# Patient Record
Sex: Male | Born: 1965 | Race: White | Hispanic: No | Marital: Married | Smoking: Current every day smoker
Health system: Southern US, Community
[De-identification: ages and names within clinical notes are randomized; demographics above are authoritative.]

## PROBLEM LIST (undated history)

## (undated) ENCOUNTER — Emergency Department (HOSPITAL_COMMUNITY): Payer: Medicare Other

## (undated) DIAGNOSIS — I10 Essential (primary) hypertension: Secondary | ICD-10-CM

## (undated) DIAGNOSIS — E7800 Pure hypercholesterolemia, unspecified: Secondary | ICD-10-CM

## (undated) DIAGNOSIS — K259 Gastric ulcer, unspecified as acute or chronic, without hemorrhage or perforation: Secondary | ICD-10-CM

## (undated) DIAGNOSIS — F329 Major depressive disorder, single episode, unspecified: Secondary | ICD-10-CM

## (undated) DIAGNOSIS — F32A Depression, unspecified: Secondary | ICD-10-CM

## (undated) DIAGNOSIS — M549 Dorsalgia, unspecified: Secondary | ICD-10-CM

## (undated) DIAGNOSIS — E119 Type 2 diabetes mellitus without complications: Secondary | ICD-10-CM

## (undated) DIAGNOSIS — N200 Calculus of kidney: Secondary | ICD-10-CM

## (undated) DIAGNOSIS — F988 Other specified behavioral and emotional disorders with onset usually occurring in childhood and adolescence: Secondary | ICD-10-CM

## (undated) DIAGNOSIS — K219 Gastro-esophageal reflux disease without esophagitis: Secondary | ICD-10-CM

## (undated) DIAGNOSIS — R011 Cardiac murmur, unspecified: Secondary | ICD-10-CM

## (undated) DIAGNOSIS — G8929 Other chronic pain: Secondary | ICD-10-CM

## (undated) DIAGNOSIS — M1990 Unspecified osteoarthritis, unspecified site: Secondary | ICD-10-CM

## (undated) DIAGNOSIS — F419 Anxiety disorder, unspecified: Secondary | ICD-10-CM

## (undated) DIAGNOSIS — G4730 Sleep apnea, unspecified: Secondary | ICD-10-CM

## (undated) DIAGNOSIS — Z8701 Personal history of pneumonia (recurrent): Secondary | ICD-10-CM

## (undated) DIAGNOSIS — Z8709 Personal history of other diseases of the respiratory system: Secondary | ICD-10-CM

## (undated) HISTORY — PX: BACK SURGERY: SHX140

## (undated) HISTORY — PX: ESOPHAGOGASTRODUODENOSCOPY: SHX1529

## (undated) HISTORY — PX: HERNIA REPAIR: SHX51

## (undated) HISTORY — PX: CARDIAC CATHETERIZATION: SHX172

## (undated) HISTORY — PX: TONSILLECTOMY: SUR1361

---

## 2001-02-08 ENCOUNTER — Inpatient Hospital Stay (HOSPITAL_COMMUNITY): Admission: EM | Admit: 2001-02-08 | Discharge: 2001-02-10 | Payer: Self-pay | Admitting: *Deleted

## 2001-02-11 ENCOUNTER — Other Ambulatory Visit (HOSPITAL_COMMUNITY): Admission: RE | Admit: 2001-02-11 | Discharge: 2001-02-17 | Payer: Self-pay | Admitting: Psychiatry

## 2001-12-26 ENCOUNTER — Emergency Department (HOSPITAL_COMMUNITY): Admission: EM | Admit: 2001-12-26 | Discharge: 2001-12-26 | Payer: Self-pay | Admitting: Emergency Medicine

## 2002-01-20 ENCOUNTER — Ambulatory Visit (HOSPITAL_COMMUNITY): Admission: RE | Admit: 2002-01-20 | Discharge: 2002-01-21 | Payer: Self-pay | Admitting: Specialist

## 2002-01-20 ENCOUNTER — Encounter: Payer: Self-pay | Admitting: Specialist

## 2002-06-10 ENCOUNTER — Observation Stay (HOSPITAL_COMMUNITY): Admission: RE | Admit: 2002-06-10 | Discharge: 2002-06-11 | Payer: Self-pay | Admitting: Specialist

## 2002-11-08 ENCOUNTER — Emergency Department (HOSPITAL_COMMUNITY): Admission: EM | Admit: 2002-11-08 | Discharge: 2002-11-08 | Payer: Self-pay | Admitting: Emergency Medicine

## 2002-11-08 ENCOUNTER — Encounter: Payer: Self-pay | Admitting: *Deleted

## 2004-03-15 ENCOUNTER — Encounter: Admission: RE | Admit: 2004-03-15 | Discharge: 2004-03-15 | Payer: Self-pay | Admitting: Specialist

## 2005-02-13 ENCOUNTER — Observation Stay (HOSPITAL_COMMUNITY): Admission: RE | Admit: 2005-02-13 | Discharge: 2005-02-14 | Payer: Self-pay | Admitting: Specialist

## 2011-07-31 ENCOUNTER — Encounter: Payer: Self-pay | Admitting: *Deleted

## 2011-07-31 ENCOUNTER — Other Ambulatory Visit: Payer: Self-pay

## 2011-07-31 ENCOUNTER — Observation Stay (HOSPITAL_COMMUNITY)
Admission: EM | Admit: 2011-07-31 | Discharge: 2011-08-01 | Disposition: A | Discharge status: Home / Self Care | Payer: Medicare (Managed Care) | Attending: Internal Medicine | Admitting: Internal Medicine

## 2011-07-31 ENCOUNTER — Emergency Department (HOSPITAL_COMMUNITY): Payer: Medicare (Managed Care)

## 2011-07-31 DIAGNOSIS — I10 Essential (primary) hypertension: Secondary | ICD-10-CM | POA: Insufficient documentation

## 2011-07-31 DIAGNOSIS — G479 Sleep disorder, unspecified: Secondary | ICD-10-CM | POA: Insufficient documentation

## 2011-07-31 DIAGNOSIS — R0789 Other chest pain: Principal | ICD-10-CM | POA: Insufficient documentation

## 2011-07-31 DIAGNOSIS — F411 Generalized anxiety disorder: Secondary | ICD-10-CM | POA: Insufficient documentation

## 2011-07-31 DIAGNOSIS — E7800 Pure hypercholesterolemia, unspecified: Secondary | ICD-10-CM | POA: Diagnosis present

## 2011-07-31 HISTORY — DX: Pure hypercholesterolemia, unspecified: E78.00

## 2011-07-31 HISTORY — DX: Essential (primary) hypertension: I10

## 2011-07-31 LAB — DIFFERENTIAL
Basophils Relative: 0 % (ref 0–1)
Eosinophils Absolute: 0.3 10*3/uL (ref 0.0–0.7)
Eosinophils Relative: 3 % (ref 0–5)
Lymphocytes Relative: 33 % (ref 12–46)
Lymphs Abs: 3.6 10*3/uL (ref 0.7–4.0)
Monocytes Absolute: 0.9 10*3/uL (ref 0.1–1.0)
Monocytes Relative: 8 % (ref 3–12)
Neutro Abs: 6.3 10*3/uL (ref 1.7–7.7)
Neutrophils Relative %: 57 % (ref 43–77)

## 2011-07-31 LAB — COMPREHENSIVE METABOLIC PANEL
ALT: 24 U/L (ref 0–53)
Albumin: 4.1 g/dL (ref 3.5–5.2)
CO2: 27 mEq/L (ref 19–32)
Calcium: 9.6 mg/dL (ref 8.4–10.5)
GFR calc Af Amer: >90 mL/min (ref >90)
GFR calc non Af Amer: 89 mL/min — ABNORMAL LOW (ref >90)
Potassium: 3.5 mEq/L (ref 3.5–5.1)
Total Bilirubin: 0.3 mg/dL (ref 0.3–1.2)

## 2011-07-31 LAB — CBC
MCH: 28.5 pg (ref 26.0–34.0)
MCHC: 33.7 g/dL (ref 30.0–36.0)

## 2011-07-31 LAB — URINALYSIS, ROUTINE W REFLEX MICROSCOPIC
Hgb urine dipstick: NEGATIVE
Ketones, ur: NEGATIVE mg/dL
Leukocytes, UA: NEGATIVE
Nitrite: NEGATIVE
Protein, ur: NEGATIVE mg/dL
Urobilinogen, UA: 0.2 mg/dL (ref 0.0–1.0)

## 2011-07-31 LAB — POCT I-STAT TROPONIN I: Troponin i, poc: 0 ng/mL (ref 0.00–0.08)

## 2011-07-31 MED ORDER — SODIUM CHLORIDE 0.9 % IV SOLN
INTRAVENOUS | Status: DC
  Administered 2011-07-31: via INTRAVENOUS

## 2011-07-31 NOTE — ED Notes (Signed)
C/o chest pain off and on x several months; reports pain is midchest, nonradiating, and feels like a tightness and pressure; reports that this pain has been coming and going x 2-3 days; states today pain onset approx 10 am and has not eased; states pain is worse with movement and deep breaths; denies dizziness; denies SOB; denies n/v; skin warm and dry.

## 2011-07-31 NOTE — ED Provider Notes (Signed)
History    Scribed for Benny Lennert, MD, the patient was seen in room APA06/APA06. This chart was scribed by Katha Cabal.   CSN: [id number] Arrival date & time: 07/31/2011  7:23 PM   First MD Initiated Contact with Patient 07/31/11 2054      Chief Complaint  Patient presents with  . Chest Pain    x 2 days    (Consider location/radiation/quality/duration/timing/severity/associated sxs/prior treatment) HPI Joseph Shepard is a 45 y.o. male who presents to the Emergency Department complaining of gradual worsening of intermittent sharp tightness chest pains since 10 AM with associated SOB x2 days.  Reports chest pain for past several months.  Denies vomiting.  Patient has baseline diaphoresis and back pain. Patient had cardiac cath 2 years ago at [facility] that was normal.   Pain worsens with exertion. Resting typically helps but not tonight.  Patient had stress test at cardiologist in [place]. Patient with history of HTN and hypercholesterolemia.     PCP Dr. Laurence Compton in [place], [place].      Past Medical History  Diagnosis Date  . Hypertension   . High cholesterol     Past Surgical History  Procedure Date  . Back surgery   . Tonsillectomy   . Hernia repair     No family history on file.  History  Substance Use Topics  . Smoking status: Not on file  . Smokeless tobacco: Not on file  . Alcohol Use: No      Review of Systems  Constitutional: Negative for fatigue. Diaphoresis: baseline.  HENT: Negative for congestion, sinus pressure and ear discharge.   Eyes: Negative for discharge.  Respiratory: Positive for shortness of breath. Negative for cough.   Cardiovascular: Positive for chest pain.  Gastrointestinal: Negative for vomiting, abdominal pain and diarrhea.  Genitourinary: Negative for frequency and hematuria.  Musculoskeletal: Back pain: baseline.  Skin: Negative for rash.  Neurological: Negative for seizures and headaches.  Hematological:  Negative.   Psychiatric/Behavioral: Negative for hallucinations.    Allergies  Review of patient's allergies indicates no known allergies.  Home Medications   Current Outpatient Rx  Name Route Sig Dispense Refill  . ASPIRIN 81 MG PO TBEC Oral Take 81 mg by mouth daily.      Marland Kitchen HYDROCHLOROTHIAZIDE 25 MG PO TABS Oral Take 25 mg by mouth daily.      Marland Kitchen LISINOPRIL 40 MG PO TABS Oral Take 40 mg by mouth daily.      Marland Kitchen PRAVASTATIN SODIUM 20 MG PO TABS Oral Take 20 mg by mouth daily.        BP 129/66  Pulse 63  Temp(Src) 98.1 F (36.7 C) (Oral)  Resp 20  Ht 5\' 9"  (1.753 m)  Wt 275 lb (124.739 kg)  BMI 40.61 kg/m2  SpO2 97%  Physical Exam  Constitutional: He is oriented to person, place, and time. He appears well-developed.  HENT:  Head: Normocephalic and atraumatic.  Eyes: Conjunctivae and EOM are normal. No scleral icterus.  Neck: Neck supple. No thyromegaly present.  Cardiovascular: Normal rate and regular rhythm.  Exam reveals no gallop and no friction rub.   No murmur heard. Pulmonary/Chest: Effort normal and breath sounds normal. No stridor. No respiratory distress. He has no wheezes. He has no rales. He exhibits no tenderness.  Abdominal: He exhibits no distension. There is no tenderness. There is no rebound.  Musculoskeletal: Normal range of motion. He exhibits no edema.  Lymphadenopathy:    He has no cervical adenopathy.  Neurological: He is alert and oriented to person, place, and time. Coordination normal.  Skin: No rash noted. He is not diaphoretic. No erythema.  Psychiatric: He has a normal mood and affect. His behavior is normal.    ED Course  Procedures (including critical care time)   DIAGNOSTIC STUDIES: Oxygen Saturation is 96% on room air, normal by my interpretation.    COORDINATION OF CARE:  9:09 PM  Physical exam complete.  Discussed possibility of admission for stress test.  Will review labs.    11:01 PM  Reviewed labs and CXR.  Discuss need for  admission with patient.   11:21 PM  Plan to admit patient.  Patient agrees with plan.     LABS / RADIOLOGY:   Labs Reviewed  COMPREHENSIVE METABOLIC PANEL - Abnormal; Notable for the following:    GFR calc non Af Amer 89 (*)    All other components within normal limits  CBC - Abnormal; Notable for the following:    WBC 11.1 (*)    RBC 4.21 (*)    Hemoglobin 12.0 (*)    HCT 35.6 (*)    All other components within normal limits  URINALYSIS, ROUTINE W REFLEX MICROSCOPIC - Abnormal; Notable for the following:    Specific Gravity, Urine >1.030 (*)    All other components within normal limits  DIFFERENTIAL  POCT I-STAT TROPONIN I  I-STAT TROPONIN I   Results for orders placed during the hospital encounter of 07/31/11  COMPREHENSIVE METABOLIC PANEL      Component Value Range   Sodium 141  135 - 145 (mEq/L)   Potassium 3.5  3.5 - 5.1 (mEq/L)   Chloride 104  96 - 112 (mEq/L)   CO2 27  19 - 32 (mEq/L)   Glucose, Bld 97  70 - 99 (mg/dL)   BUN 16  6 - 23 (mg/dL)   Creatinine, Ser 6.21  0.50 - 1.35 (mg/dL)   Calcium 9.6  8.4 - 30.8 (mg/dL)   Total Protein 7.1  6.0 - 8.3 (g/dL)   Albumin 4.1  3.5 - 5.2 (g/dL)   AST 20  0 - 37 (U/L)   ALT 24  0 - 53 (U/L)   Alkaline Phosphatase 49  39 - 117 (U/L)   Total Bilirubin 0.3  0.3 - 1.2 (mg/dL)   GFR calc non Af Amer 89 (*) >90 (mL/min)   GFR calc Af Amer >90  >90 (mL/min)  CBC      Component Value Range   WBC 11.1 (*) 4.0 - 10.5 (K/uL)   RBC 4.21 (*) 4.22 - 5.81 (MIL/uL)   Hemoglobin 12.0 (*) 13.0 - 17.0 (g/dL)   HCT 65.7 (*) 84.6 - 52.0 (%)   MCV 84.6  78.0 - 100.0 (fL)   MCH 28.5  26.0 - 34.0 (pg)   MCHC 33.7  30.0 - 36.0 (g/dL)   RDW 96.2  95.2 - 84.1 (%)   Platelets 226  150 - 400 (K/uL)  DIFFERENTIAL      Component Value Range   Neutrophils Relative 57  43 - 77 (%)   Neutro Abs 6.3  1.7 - 7.7 (K/uL)   Lymphocytes Relative 33  12 - 46 (%)   Lymphs Abs 3.6  0.7 - 4.0 (K/uL)   Monocytes Relative 8  3 - 12 (%)   Monocytes  Absolute 0.9  0.1 - 1.0 (K/uL)   Eosinophils Relative 3  0 - 5 (%)   Eosinophils Absolute 0.3  0.0 - 0.7 (K/uL)  Basophils Relative 0  0 - 1 (%)   Basophils Absolute 0.0  0.0 - 0.1 (K/uL)  URINALYSIS, ROUTINE W REFLEX MICROSCOPIC      Component Value Range   Color, Urine YELLOW  YELLOW    Appearance CLEAR  CLEAR    Specific Gravity, Urine >1.030 (*) 1.005 - 1.030    pH 6.0  5.0 - 8.0    Glucose, UA NEGATIVE  NEGATIVE (mg/dL)   Hgb urine dipstick NEGATIVE  NEGATIVE    Bilirubin Urine NEGATIVE  NEGATIVE    Ketones, ur NEGATIVE  NEGATIVE (mg/dL)   Protein, ur NEGATIVE  NEGATIVE (mg/dL)   Urobilinogen, UA 0.2  0.0 - 1.0 (mg/dL)   Nitrite NEGATIVE  NEGATIVE    Leukocytes, UA NEGATIVE  NEGATIVE   POCT I-STAT TROPONIN I      Component Value Range   Troponin i, poc 0.00  0.00 - 0.08 (ng/mL)   Comment 3              Dg Chest Portable 1 View  07/31/2011  *RADIOLOGY REPORT*  Clinical Data: Chest pain.  PORTABLE CHEST - 1 VIEW  Comparison: None.  Findings: Heart and mediastinal contours are within normal limits. No focal opacities or effusions.  No acute bony abnormality.  IMPRESSION: No active cardiopulmonary disease.  Original Report Authenticated By: Cyndie Chime, M.D.        MDM      MEDICATIONS GIVEN IN THE E.D. Scheduled Meds:    . sodium chloride   Intravenous STAT   Continuous Infusions:     Orders Placed This Encounter  Procedures  . DG Chest Portable 1 View  . Comprehensive metabolic panel  . CBC  . Differential  . Urinalysis with microscopic  . Diet regular  . Page Admitting Doctor upon patients arrival to unit/floor  . Vital signs  . POCT i-Stat troponin I  . ED EKG  . Place in observation (from OR/ED)      IMPRESSION: 1. Chest pain     Date: 07/31/2011  Rate:66  Rhythm: normal sinus rhythm  QRS Axis: normal    Intervals: normal  ST/T Wave abnormalities: Lvh  Conduction Disutrbances:none     Narrative Interpretation:   Old EKG Reviewed:  none available     The chart was scribed for me under my direct supervision.  I personally performed the history, physical, and medical decision making and all procedures in the evaluation of this patient.Benny Lennert, MD 07/31/11 [phone]

## 2011-07-31 NOTE — ED Notes (Signed)
Also reports had cardiac cath done in July 2010 and was told "everything was fine"; states he did not go back for his f/u appt after cath.

## 2011-07-31 NOTE — ED Notes (Signed)
Pt states CP in center of chest non radiating; describes as pressure and rates as 6. Pt had a cardiac cath July 2010 states WNL however no follow up with MD afterward.  Pt does follow with primary MD.

## 2011-07-31 NOTE — H&P (Signed)
PCP:   No primary provider on file.   Chief Complaint:  Substernal chest pain.  HPI: Joseph Shepard is an 45 y.o. male with history of hypertension, hypercholesterolemia, and anxiety, presents to [facility] emergency room emergency room with a complaint of substernal chest pain. He stated he has took several chest pain and pressure lasting approximately 2 hours today. He denied any nausea vomiting but had a little bit of diaphoresis. He denied shortness of breath. He denies having exertional chest pain as he has not been very active since he hurt his back. Of importance is that he had a cardiac catheterization approximately 2 years ago, and he was told that it was normal.  He has been very compliant with his medications including pravastatin and lisinopril HCTZ. Normally he does not taking aspirin, but he did take an aspirin today. In the emergency room he is pain-free. He did admit to be anxious in general.  His wife also states that he snores loudly when he sleeps, and that he has periods of apnea. He never had a polysomnogram.  Hospitalist was asked to admit him for rule out.  Past Medical History  Diagnosis Date  . Hypertension   . High cholesterol     Past Surgical History  Procedure Date  . Back surgery   . Tonsillectomy   . Hernia repair     Medications:  HOME MEDS: Prior to Admission medications   Medication Sig Start Date End Date Taking? Authorizing Provider  aspirin 81 MG EC tablet Take 81 mg by mouth daily.     Yes Historical Provider, MD  hydrochlorothiazide (HYDRODIURIL) 25 MG tablet Take 25 mg by mouth daily.     Yes Historical Provider, MD  lisinopril (PRINIVIL,ZESTRIL) 40 MG tablet Take 40 mg by mouth daily.     Yes Historical Provider, MD  pravastatin (PRAVACHOL) 20 MG tablet Take 20 mg by mouth daily.     Yes Historical Provider, MD    PRIOR TO [facility] MEDS Medications Prior to Admission  Medication Dose Route Frequency Provider Last Rate Last Dose  . 0.9 %   sodium chloride infusion   Intravenous STAT Benny Lennert, MD       No current outpatient prescriptions on file as of 07/31/2011.    Allergies:  No Known Allergies  Social History:   does not have a smoking history on file. He does not have any smokeless tobacco history on file. He reports that he uses illicit drugs (Marijuana). He reports that he does not drink alcohol.  Family History: He has  No premature CAD in his immediate family.  Rewiew of Systems:  The patient denies anorexia, fever, weight loss,, vision loss, decreased hearing, hoarseness,  syncope, dyspnea on exertion, peripheral edema, balance deficits, hemoptysis, abdominal pain, melena, hematochezia, severe indigestion/heartburn, hematuria, incontinence, genital sores, muscle weakness, suspicious skin lesions, transient blindness, difficulty walking, depression, unusual weight change, abnormal bleeding, enlarged lymph nodes, angioedema, and breast masses.   Physical Exam: Filed Vitals:   07/31/11 1913 07/31/11 2027 07/31/11 2126 07/31/11 2231  BP: 135/75 121/69 116/68 129/66  Pulse: 67 68 68 63  Temp: 98.1 F (36.7 C)     TempSrc: Oral     Resp: 20 18 20 20   Height: 5\' 9"  (1.753 m)     Weight: 124.739 kg (275 lb)     SpO2: 98% 96% 96% 97%   Blood pressure 129/66, pulse 63, temperature 98.1 F (36.7 C), temperature source Oral, resp. rate 20, height  5\' 9"  (1.753 m), weight 124.739 kg (275 lb), SpO2 97.00%.  [id number] not yet examined person lying in the stretcher in no acute distress; cooperative with exam PSYCH: He is alert and oriented x4; does not appear anxious does not appear depressed; affect is normal HEENT: Mucous membranes pink and anicteric; PERRLA; EOM intact; no cervical lymphadenopathy nor thyromegaly or carotid bruit; no JVD; Breasts:: Not examined CHEST WALL: No tenderness CHEST: Normal respiration, clear to auscultation bilaterally HEART: Regular rate and rhythm; no murmurs rubs or  gallops BACK: No kyphosis or scoliosis; no CVA tenderness ABDOMEN: Obese, soft non-tender; no masses, no organomegaly, normal abdominal bowel sounds; no pannus; no intertriginous candida. Rectal Exam: Not done EXTREMITIES: No bone or joint deformity; age-appropriate arthropathy of the hands and knees; no edema; no ulcerations. Genitalia: not examined PULSES: 2+ and symmetric SKIN: Normal hydration no rash or ulceration CNS: Cranial nerves 2-12 grossly intact no focal neurologic deficit   Labs & Imaging Results for orders placed during the hospital encounter of 07/31/11 (from the past 48 hour(s))  POCT I-STAT TROPONIN I     Status: Normal   Collection Time   07/31/11  7:38 PM      Component Value Range Comment   Troponin i, poc 0.00  0.00 - 0.08 (ng/mL)    Comment 3            COMPREHENSIVE METABOLIC PANEL     Status: Abnormal   Collection Time   07/31/11  7:54 PM      Component Value Range Comment   Sodium 141  135 - 145 (mEq/L)    Potassium 3.5  3.5 - 5.1 (mEq/L)    Chloride 104  96 - 112 (mEq/L)    CO2 27  19 - 32 (mEq/L)    Glucose, Bld 97  70 - 99 (mg/dL)    BUN 16  6 - 23 (mg/dL)    Creatinine, Ser 9.81  0.50 - 1.35 (mg/dL)    Calcium 9.6  8.4 - 10.5 (mg/dL)    Total Protein 7.1  6.0 - 8.3 (g/dL)    Albumin 4.1  3.5 - 5.2 (g/dL)    AST 20  0 - 37 (U/L)    ALT 24  0 - 53 (U/L)    Alkaline Phosphatase 49  39 - 117 (U/L)    Total Bilirubin 0.3  0.3 - 1.2 (mg/dL)    GFR calc non Af Amer 89 (*) >90 (mL/min)    GFR calc Af Amer >90  >90 (mL/min)   CBC     Status: Abnormal   Collection Time   07/31/11  7:54 PM      Component Value Range Comment   WBC 11.1 (*) 4.0 - 10.5 (K/uL)    RBC 4.21 (*) 4.22 - 5.81 (MIL/uL)    Hemoglobin 12.0 (*) 13.0 - 17.0 (g/dL)    HCT 19.1 (*) 47.8 - 52.0 (%)    MCV 84.6  78.0 - 100.0 (fL)    MCH 28.5  26.0 - 34.0 (pg)    MCHC 33.7  30.0 - 36.0 (g/dL)    RDW 29.5  62.1 - 30.8 (%)    Platelets 226  150 - 400 (K/uL)   DIFFERENTIAL     Status:  Normal   Collection Time   07/31/11  7:54 PM      Component Value Range Comment   Neutrophils Relative 57  43 - 77 (%)    Neutro Abs 6.3  1.7 - 7.7 (K/uL)  Lymphocytes Relative 33  12 - 46 (%)    Lymphs Abs 3.6  0.7 - 4.0 (K/uL)    Monocytes Relative 8  3 - 12 (%)    Monocytes Absolute 0.9  0.1 - 1.0 (K/uL)    Eosinophils Relative 3  0 - 5 (%)    Eosinophils Absolute 0.3  0.0 - 0.7 (K/uL)    Basophils Relative 0  0 - 1 (%)    Basophils Absolute 0.0  0.0 - 0.1 (K/uL)   URINALYSIS, ROUTINE W REFLEX MICROSCOPIC     Status: Abnormal   Collection Time   07/31/11  8:20 PM      Component Value Range Comment   Color, Urine YELLOW  YELLOW     Appearance CLEAR  CLEAR     Specific Gravity, Urine >1.030 (*) 1.005 - 1.030     pH 6.0  5.0 - 8.0     Glucose, UA NEGATIVE  NEGATIVE (mg/dL)    Hgb urine dipstick NEGATIVE  NEGATIVE     Bilirubin Urine NEGATIVE  NEGATIVE     Ketones, ur NEGATIVE  NEGATIVE (mg/dL)    Protein, ur NEGATIVE  NEGATIVE (mg/dL)    Urobilinogen, UA 0.2  0.0 - 1.0 (mg/dL)    Nitrite NEGATIVE  NEGATIVE     Leukocytes, UA NEGATIVE  NEGATIVE  MICROSCOPIC NOT DONE ON URINES WITH NEGATIVE PROTEIN, BLOOD, LEUKOCYTES, NITRITE, OR GLUCOSE <1000 mg/dL.   Dg Chest Portable 1 View  07/31/2011  *RADIOLOGY REPORT*  Clinical Data: Chest pain.  PORTABLE CHEST - 1 VIEW  Comparison: None.  Findings: Heart and mediastinal contours are within normal limits. No focal opacities or effusions.  No acute bony abnormality.  IMPRESSION: No active cardiopulmonary disease.  Original Report Authenticated By: Cyndie Chime, M.D.      Assessment Present on Admission:  .Chest pain, atypical .Hypercholesterolemia .HTN (hypertension), benign  PLAN:  This is a 45 year old male with increased cardiac risk factors admitted for atypical chest pain. I do not think that his pain is cardiac in its etiology. Given that he has a catheterization 2 years ago, it is unlikely that he has a high grade  obstructive coronary lesions.  His symptoms also argues against acute coronary syndrome.  We'll admit him for rule out. Risk stratifications discussed at length, including the need to stop marijuana use, being active and eating healthy. He also should have a polysomnogram as an outpatient, as I'm suspicious that he has sleep apnea.  Will put him on an aspirin a day, along with continuation of this medications to include lisinopril HCTZ and Pravachol.  He is stable.   Other plans as per orders.    Domingo Fuson 07/31/2011, 11:52 PM

## 2011-08-01 ENCOUNTER — Encounter (HOSPITAL_COMMUNITY): Payer: Self-pay | Admitting: *Deleted

## 2011-08-01 LAB — BASIC METABOLIC PANEL
BUN: 16 mg/dL (ref 6–23)
CO2: 28 mEq/L (ref 19–32)
Calcium: 9.3 mg/dL (ref 8.4–10.5)
Creatinine, Ser: 1.01 mg/dL (ref 0.50–1.35)
GFR calc non Af Amer: >90 mL/min (ref >90)
Glucose, Bld: 111 mg/dL — ABNORMAL HIGH (ref 70–99)
Sodium: 141 mEq/L (ref 135–145)

## 2011-08-01 LAB — CBC
MCH: 28.5 pg (ref 26.0–34.0)
MCHC: 33.3 g/dL (ref 30.0–36.0)
MCV: 85.5 fL (ref 78.0–100.0)
Platelets: 202 10*3/uL (ref 150–400)
RDW: 15.3 % (ref 11.5–15.5)

## 2011-08-01 LAB — CARDIAC PANEL(CRET KIN+CKTOT+MB+TROPI)
CK, MB: 4.6 ng/mL — ABNORMAL HIGH (ref 0.3–4.0)
Total CK: 336 U/L — ABNORMAL HIGH (ref 7–232)

## 2011-08-01 MED ORDER — ZOLPIDEM TARTRATE 5 MG PO TABS: 10.0000 mg | ORAL_TABLET | Freq: Every evening | ORAL | Status: DC | PRN

## 2011-08-01 MED ORDER — LISINOPRIL 10 MG PO TABS
40.0000 mg | ORAL_TABLET | Freq: Every day | ORAL | Status: DC
  Administered 2011-08-01: 40 mg via ORAL
  Filled 2011-08-01: qty 4

## 2011-08-01 MED ORDER — MORPHINE SULFATE 2 MG/ML IJ SOLN
2.0000 mg | INTRAMUSCULAR | Status: DC | PRN
  Administered 2011-08-01: 2 mg via INTRAVENOUS
  Filled 2011-08-01: qty 1

## 2011-08-01 MED ORDER — ENOXAPARIN SODIUM 40 MG/0.4ML ~~LOC~~ SOLN: 40.0000 mg | SUBCUTANEOUS | Status: DC

## 2011-08-01 MED ORDER — OXYCODONE-ACETAMINOPHEN 5-325 MG PO TABS: 1.0000 | ORAL_TABLET | ORAL | Status: AC | PRN

## 2011-08-01 MED ORDER — SODIUM CHLORIDE 0.9 % IJ SOLN: 3.0000 mL | INTRAMUSCULAR | Status: DC | PRN

## 2011-08-01 MED ORDER — SODIUM CHLORIDE 0.9 % IJ SOLN
3.0000 mL | Freq: Two times a day (BID) | INTRAMUSCULAR | Status: DC
  Administered 2011-08-01 (×2): 3 mL via INTRAVENOUS
  Filled 2011-08-01: qty 3

## 2011-08-01 MED ORDER — ASPIRIN 325 MG PO TABS
325.0000 mg | ORAL_TABLET | Freq: Every day | ORAL | Status: DC
  Administered 2011-08-01: 325 mg via ORAL
  Filled 2011-08-01: qty 1

## 2011-08-01 MED ORDER — SODIUM CHLORIDE 0.9 % IV SOLN
250.0000 mL | INTRAVENOUS | Status: DC
  Administered 2011-08-01: 250 mL via INTRAVENOUS

## 2011-08-01 MED ORDER — SIMVASTATIN 10 MG PO TABS: 10.0000 mg | ORAL_TABLET | Freq: Every day | ORAL | Status: DC

## 2011-08-01 MED ORDER — HYDROCHLOROTHIAZIDE 25 MG PO TABS
25.0000 mg | ORAL_TABLET | Freq: Every day | ORAL | Status: DC
  Administered 2011-08-01: 25 mg via ORAL
  Filled 2011-08-01: qty 1

## 2011-08-01 MED ORDER — ENOXAPARIN SODIUM 40 MG/0.4ML ~~LOC~~ SOLN
40.0000 mg | Freq: Every day | SUBCUTANEOUS | Status: DC
  Administered 2011-08-01: 40 mg via SUBCUTANEOUS
  Filled 2011-08-01: qty 0.4

## 2011-08-01 MED ORDER — ALUM & MAG HYDROXIDE-SIMETH 200-200-20 MG/5ML PO SUSP: 30.0000 mL | Freq: Four times a day (QID) | ORAL | Status: DC | PRN

## 2011-08-01 NOTE — Discharge Summary (Signed)
DISCHARGE SUMMARY  Joseph Shepard  MR#: [id number]  DOB:Jun 05, 1966  Date of Admission: 07/31/2011 Date of Discharge: 08/01/2011  Attending Physician:Jaliyah Fotheringham A  Patient's PCP: Laurence Compton  Discharge Diagnoses: .Chest pain, atypical .Hypercholesterolemia .HTN (hypertension), benign    Current Discharge Medication List    START taking these medications   Details  oxyCODONE-acetaminophen (ROXICET) 5-325 MG per tablet Take 1 tablet by mouth every 4 (four) hours as needed for pain. Qty: 30 tablet, Refills: 0      CONTINUE these medications which have NOT CHANGED   Details  aspirin 81 MG EC tablet Take 81 mg by mouth daily.      hydrochlorothiazide (HYDRODIURIL) 25 MG tablet Take 25 mg by mouth daily.      lisinopril (PRINIVIL,ZESTRIL) 40 MG tablet Take 40 mg by mouth daily.      pravastatin (PRAVACHOL) 20 MG tablet Take 20 mg by mouth daily.         Brief history and examination Joseph Shepard is an 45 y.o. male with history of hypertension, hypercholesterolemia, and anxiety, presents to [facility] emergency room emergency room with a complaint of substernal chest pain. He stated he has took several chest pain and pressure lasting approximately 2 hours today. He denied any nausea vomiting but had a little bit of diaphoresis. He denied shortness of breath. He denies having exertional chest pain as he has not been very active since he hurt his back. Of importance is that he had a cardiac catheterization approximately 2 years ago, and he was told that it was normal. He has been very compliant with his medications including pravastatin and lisinopril HCTZ. Normally he does not taking aspirin, but he did take an aspirin today. In the emergency room he is pain-free. He did admit to be anxious in general. His wife also states that he snores loudly when he sleeps, and that he has periods of apnea. He never had a polysomnogram. Hospitalist was asked to admit him for rule  out.    Hospital Course: . Chest pain, atypical Patient probably has atypical chest pain. Admitted for acute coronary syndrome rule out. 3 sets of cardiac enzymes were obtained as well as EKG was done at admission and in the morning did not show any evidence of myocardial ischemia. Patient chest x-ray is okay. The pain he was mentioned is quite atypical which is left sided. And comes and goes. Patient is taking aspirin and lisinopril for blood pressure he was asked to continue these medications. patient denies any history of gastroesophageal reflux disease. Denies any history of musculoskeletal scleral problems. Patient is discharged on Percocet for pain after the workup showed no active acute cardiopulmonary process.  . Hypercholesterolemia Patient is taking pravastatin for hypercholesterolemia. He self him his primary care physician his statin medication to continue throughout the hospital stay.  Marland KitchenHTN (hypertension), benign: Patient is on lisinopril and hydrochlorothiazide for his blood pressure that was continued throughout the hospital stay his been normotensive during this hospital stay.  Probable obstructive sleep apnea. Patient mentioned that he does have a lot of sleep apnea symptoms. Which is consistent with his ability habitus. Patient does have snoring at nighttime. Apnea. In daytime sleepiness. According to his girlfriend he gets apnea episodes very frequently during nighttime. I think the patient will benefit from outpatient polysomnogram  Day of Discharge BP 123/74  Pulse 55  Temp(Src) 98.1 F (36.7 C) (Oral)  Resp 16  Ht 5\' 9"  (1.753 m)  Wt 124.739 kg (275 lb)  BMI 40.61 kg/m2  SpO2 97%  Physical Exam: NWG:NFAOZHYQ not yet examined person lying in the stretcher in no acute distress; cooperative with exam  PSYCH: He is alert and oriented x4; does not appear anxious does not appear depressed; affect is normal  HEENT: Mucous membranes pink and anicteric; PERRLA; EOM intact;  no cervical lymphadenopathy nor thyromegaly or carotid bruit; no JVD;  CHEST WALL: No tenderness  CHEST: Normal respiration, clear to auscultation bilaterally  HEART: Regular rate and rhythm; no murmurs rubs or gallops  ABDOMEN: Obese, soft non-tender; no masses, no organomegaly, normal abdominal bowel sounds; no pannus; no intertriginous candida.  EXTREMITIES: No bone or joint deformity; age-appropriate arthropathy of the hands and knees; no edema; no ulcerations.  PULSES: 2+ and symmetric  SKIN: Normal hydration no rash or ulceration  CNS: Cranial nerves 2-12 grossly intact no focal neurologic deficit   Results for orders placed during the hospital encounter of 07/31/11 (from the past 24 hour(s))  POCT I-STAT TROPONIN I     Status: Normal   Collection Time   07/31/11  7:38 PM      Component Value Range   Troponin i, poc 0.00  0.00 - 0.08 (ng/mL)   Comment 3           COMPREHENSIVE METABOLIC PANEL     Status: Abnormal   Collection Time   07/31/11  7:54 PM      Component Value Range   Sodium 141  135 - 145 (mEq/L)   Potassium 3.5  3.5 - 5.1 (mEq/L)   Chloride 104  96 - 112 (mEq/L)   CO2 27  19 - 32 (mEq/L)   Glucose, Bld 97  70 - 99 (mg/dL)   BUN 16  6 - 23 (mg/dL)   Creatinine, Ser 6.57  0.50 - 1.35 (mg/dL)   Calcium 9.6  8.4 - 84.6 (mg/dL)   Total Protein 7.1  6.0 - 8.3 (g/dL)   Albumin 4.1  3.5 - 5.2 (g/dL)   AST 20  0 - 37 (U/L)   ALT 24  0 - 53 (U/L)   Alkaline Phosphatase 49  39 - 117 (U/L)   Total Bilirubin 0.3  0.3 - 1.2 (mg/dL)   GFR calc non Af Amer 89 (*) >90 (mL/min)   GFR calc Af Amer >90  >90 (mL/min)  CBC     Status: Abnormal   Collection Time   07/31/11  7:54 PM      Component Value Range   WBC 11.1 (*) 4.0 - 10.5 (K/uL)   RBC 4.21 (*) 4.22 - 5.81 (MIL/uL)   Hemoglobin 12.0 (*) 13.0 - 17.0 (g/dL)   HCT 96.2 (*) 95.2 - 52.0 (%)   MCV 84.6  78.0 - 100.0 (fL)   MCH 28.5  26.0 - 34.0 (pg)   MCHC 33.7  30.0 - 36.0 (g/dL)   RDW 84.1  32.4 - 40.1 (%)    Platelets 226  150 - 400 (K/uL)  DIFFERENTIAL     Status: Normal   Collection Time   07/31/11  7:54 PM      Component Value Range   Neutrophils Relative 57  43 - 77 (%)   Neutro Abs 6.3  1.7 - 7.7 (K/uL)   Lymphocytes Relative 33  12 - 46 (%)   Lymphs Abs 3.6  0.7 - 4.0 (K/uL)   Monocytes Relative 8  3 - 12 (%)   Monocytes Absolute 0.9  0.1 - 1.0 (K/uL)   Eosinophils Relative 3  0 -  5 (%)   Eosinophils Absolute 0.3  0.0 - 0.7 (K/uL)   Basophils Relative 0  0 - 1 (%)   Basophils Absolute 0.0  0.0 - 0.1 (K/uL)  URINALYSIS, ROUTINE W REFLEX MICROSCOPIC     Status: Abnormal   Collection Time   07/31/11  8:20 PM      Component Value Range   Color, Urine YELLOW  YELLOW    Appearance CLEAR  CLEAR    Specific Gravity, Urine >1.030 (*) 1.005 - 1.030    pH 6.0  5.0 - 8.0    Glucose, UA NEGATIVE  NEGATIVE (mg/dL)   Hgb urine dipstick NEGATIVE  NEGATIVE    Bilirubin Urine NEGATIVE  NEGATIVE    Ketones, ur NEGATIVE  NEGATIVE (mg/dL)   Protein, ur NEGATIVE  NEGATIVE (mg/dL)   Urobilinogen, UA 0.2  0.0 - 1.0 (mg/dL)   Nitrite NEGATIVE  NEGATIVE    Leukocytes, UA NEGATIVE  NEGATIVE   CARDIAC PANEL(CRET KIN+CKTOT+MB+TROPI)     Status: Abnormal   Collection Time   08/01/11  5:25 AM      Component Value Range   Total CK 336 (*) 7 - 232 (U/L)   CK, MB 4.6 (*) 0.3 - 4.0 (ng/mL)   Troponin I <0.30  <0.30 (ng/mL)   Relative Index 1.4  0.0 - 2.5   BASIC METABOLIC PANEL     Status: Abnormal   Collection Time   08/01/11  5:25 AM      Component Value Range   Sodium 141  135 - 145 (mEq/L)   Potassium 4.0  3.5 - 5.1 (mEq/L)   Chloride 105  96 - 112 (mEq/L)   CO2 28  19 - 32 (mEq/L)   Glucose, Bld 111 (*) 70 - 99 (mg/dL)   BUN 16  6 - 23 (mg/dL)   Creatinine, Ser 1.61  0.50 - 1.35 (mg/dL)   Calcium 9.3  8.4 - 09.6 (mg/dL)   GFR calc non Af Amer >90  >90 (mL/min)   GFR calc Af Amer >90  >90 (mL/min)  CBC     Status: Abnormal   Collection Time   08/01/11  5:25 AM      Component Value Range    WBC 8.9  4.0 - 10.5 (K/uL)   RBC 4.35  4.22 - 5.81 (MIL/uL)   Hemoglobin 12.4 (*) 13.0 - 17.0 (g/dL)   HCT 04.5 (*) 40.9 - 52.0 (%)   MCV 85.5  78.0 - 100.0 (fL)   MCH 28.5  26.0 - 34.0 (pg)   MCHC 33.3  30.0 - 36.0 (g/dL)   RDW 81.1  91.4 - 78.2 (%)   Platelets 202  150 - 400 (K/uL)    Disposition: Home  Signed: Kenijah Benningfield A 08/01/2011, 4:20 PM

## 2011-08-01 NOTE — ED Notes (Signed)
Report called to Alicia,pt transported to RM 321 in stable condition

## 2011-09-14 ENCOUNTER — Ambulatory Visit: Payer: Medicare (Managed Care) | Attending: Family Medicine | Admitting: Sleep Medicine

## 2011-09-14 DIAGNOSIS — Z6838 Body mass index (BMI) 38.0-38.9, adult: Secondary | ICD-10-CM | POA: Insufficient documentation

## 2011-09-14 DIAGNOSIS — G4730 Sleep apnea, unspecified: Secondary | ICD-10-CM

## 2011-09-14 DIAGNOSIS — G4733 Obstructive sleep apnea (adult) (pediatric): Secondary | ICD-10-CM | POA: Insufficient documentation

## 2011-09-20 NOTE — Procedures (Signed)
Joseph Shepard, Joseph Shepard             ACCOUNT NO.:  [id number]  MEDICAL RECORD NO.:  [id number]          PATIENT TYPE:  OUT  LOCATION:  SLEEP LAB                     FACILITY:  [facility]  PHYSICIAN:  Rashel Okeefe A. Gerilyn Pilgrim, M.D. DATE OF BIRTH:  08-18-1966  DATE OF STUDY:  09/14/2011                           NOCTURNAL POLYSOMNOGRAM  REFERRING PHYSICIAN:  Laurence Compton, MD  INDICATION:  A 45 year old man who presents with hypersomnia, obesity, and snoring.  The study is being done to evaluate for obstructive sleep apnea syndrome.  MEDICATIONS:  Adderall, hydrocodone, Pravachol, lisinopril, and hydrochlorothiazide.  EPWORTH SLEEPINESS SCALE:  8.  BMI:  38.  ARCHITECTURAL SUMMARY:  The total recording time 394 minutes.  Sleep efficiency 72%.  Sleep latency 17 minutes.  REM latency 71 minute. Stage N1 of 14%, N2 of 71%, N3 of 6%, and REM sleep 9%.  RESPIRATORY SUMMARY:  Baseline oxygen saturation 97, lowest saturation 83 during non-REM sleep.  Diagnostic AHI is 47.  RDI also 47.  The patient had a significant number of central sleep apneas and hypopneas throughout the recording.  LIMB MOVEMENT SUMMARY:  PLM index 0.  ELECTROCARDIOGRAM SUMMARY:  Average heart rate is 70 with no significant dysrhythmias observed.  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  RECOMMENDATION:  Formal positive pressure titration/ CPAP study.  Thanks for this referral.   Latresa Gasser A. Gerilyn Pilgrim, M.D.    KAD/MEDQ  D:  09/20/2011 17:58:02  T:  09/20/2011 45:40:98  Job:  [id number]

## 2011-10-01 ENCOUNTER — Ambulatory Visit: Payer: Medicare (Managed Care) | Attending: Family Medicine | Admitting: Sleep Medicine

## 2011-10-01 DIAGNOSIS — G4733 Obstructive sleep apnea (adult) (pediatric): Secondary | ICD-10-CM | POA: Insufficient documentation

## 2011-10-01 DIAGNOSIS — Z6836 Body mass index (BMI) 36.0-36.9, adult: Secondary | ICD-10-CM | POA: Insufficient documentation

## 2011-10-01 DIAGNOSIS — G4700 Insomnia, unspecified: Secondary | ICD-10-CM

## 2011-10-05 NOTE — Procedures (Signed)
NAME:  Joseph Shepard, Joseph Shepard             ACCOUNT NO.:  [id number]  MEDICAL RECORD NO.:  [id number]          PATIENT TYPE:  OUT  LOCATION:  SLEEP LAB                     FACILITY:  [facility]  PHYSICIAN:  Bret Stamour A. Gerilyn Pilgrim, M.D. DATE OF BIRTH:  Nov 09, 1965  DATE OF STUDY:  10/01/2011                           NOCTURNAL POLYSOMNOGRAM  REFERRING PHYSICIAN:  MICHELLE CAMPBELL  INDICATION:  This is a 45 year old who has symptoms of obstructive sleep apnea syndrome that was confirmed on a previous study showing significant apnea.  This is a CPAP titration recording.   MEDICATIONS:  Lisinopril/hydrochlorothiazide, Pravachol, Adderall, hydrocodone.  EPWORTH SLEEPINESS SCALE: 1. BMI 38.  ARCHITECTURAL SUMMARY:  The total recording time is 427 minutes.  Sleep efficiency is 86%.  Sleep latency 3 minutes.  REM latency 51 minutes. Stage N1 is 7.7%, N2 33%, N3 23%, and REM sleep 36%.  RESPIRATORY SUMMARY:  Baseline oxygen saturation 94, lowest saturation 82.  The patient was placed on positive pressure at 5 and increased the pressure of 10.  He also was tried on bi-level pressures briefly, but optimal pressure is 10.  LIMB MOVEMENT SUMMARY:  PLM index is 36.  ELECTROCARDIOGRAM SUMMARY:  Average heart rate is 56 with no significant dysrhythmias observed.  IMPRESSION: 1. Obstructive sleep apnea syndrome, which responds well to a CPAP of     10. 2. Severe periodic limb movement disorder sleep.  RECOMMENDATION: 1. CPAP of 10. 2. Consider dopamine agonist such as ReQuip or Mirapex for the     nocturnal limb movements.  Thanks for this referral.    Karolee Meloni A. Gerilyn Pilgrim, M.D.    KAD/MEDQ  D:  10/04/2011 17:00:48  T:  10/05/2011 08:56:33  Job:  [id number]

## 2011-11-25 ENCOUNTER — Encounter (HOSPITAL_COMMUNITY): Payer: Self-pay | Admitting: *Deleted

## 2011-11-25 ENCOUNTER — Emergency Department (HOSPITAL_COMMUNITY)
Admission: EM | Admit: 2011-11-25 | Discharge: 2011-11-25 | Disposition: A | Discharge status: Home / Self Care | Payer: Medicare (Managed Care) | Attending: Emergency Medicine | Admitting: Emergency Medicine

## 2011-11-25 DIAGNOSIS — J069 Acute upper respiratory infection, unspecified: Secondary | ICD-10-CM

## 2011-11-25 HISTORY — DX: Sleep apnea, unspecified: G47.30

## 2011-11-25 LAB — RAPID STREP SCREEN (MED CTR MEBANE ONLY): Streptococcus, Group A Screen (Direct): NEGATIVE

## 2011-11-25 MED ORDER — BENZONATATE 100 MG PO CAPS: 100.0000 mg | ORAL_CAPSULE | Freq: Three times a day (TID) | ORAL | Status: AC | PRN

## 2011-11-25 NOTE — ED Provider Notes (Signed)
History     CSN: [id number]  Arrival date & time 11/25/11  1745   First MD Initiated Contact with Patient 11/25/11 1759      Chief Complaint  Patient presents with  . flu like sx      HPI Pt was seen at 1800.  Per pt, c/o gradual onset and persistence of constant runny/stuffy nose, generalized body aches/fatigue, sore throat, cough, sinus congestion, and subjective chills x4 days.  Denies N/V/D, no rash, no abd pain, no CP/SOB, no back pain.     Past Medical History  Diagnosis Date  . Hypertension   . High cholesterol   . Sleep apnea     Past Surgical History  Procedure Date  . Back surgery   . Tonsillectomy   . Hernia repair      History  Substance Use Topics  . Smoking status: Current Everyday Smoker  . Smokeless tobacco: Not on file  . Alcohol Use: No    Review of Systems ROS: Statement: All systems negative except as marked or noted in the HPI; Constitutional: Negative for fever and +chills, generalized body aches/fatigue ; ; Eyes: Negative for eye pain, redness and discharge. ; ; ENMT: Negative for ear pain, hoarseness, +nasal congestion, rhinorrhea, sinus pressure and sore throat. ; ; Cardiovascular: Negative for chest pain, palpitations, diaphoresis, dyspnea and peripheral edema. ; ; Respiratory: +cough. Negative for wheezing and stridor. ; ; Gastrointestinal: Negative for nausea, vomiting, diarrhea, abdominal pain, blood in stool, hematemesis, jaundice and rectal bleeding. . ; ; Genitourinary: Negative for dysuria, flank pain and hematuria. ; ; Musculoskeletal: Negative for back pain and neck pain. Negative for swelling and trauma.; ; Skin: Negative for pruritus, rash, abrasions, blisters, bruising and skin lesion.; ; Neuro: Negative for headache, lightheadedness and neck stiffness. Negative for weakness, altered level of consciousness , altered mental status, extremity weakness, paresthesias, involuntary movement, seizure and syncope.       Allergies  Review of  patient's allergies indicates no known allergies.  Home Medications   Current Outpatient Rx  Name Route Sig Dispense Refill  . HYDROCHLOROTHIAZIDE 25 MG PO TABS Oral Take 25 mg by mouth daily.      Marland Kitchen LISINOPRIL 40 MG PO TABS Oral Take 40 mg by mouth daily.      Marland Kitchen PRAVASTATIN SODIUM 20 MG PO TABS Oral Take 20 mg by mouth daily.        BP 139/84  Pulse 69  Temp(Src) 98.5 F (36.9 C) (Oral)  Resp 20  Ht 5\' 9"  (1.753 m)  Wt 265 lb (120.203 kg)  BMI 39.13 kg/m2  SpO2 97%  Physical Exam 1805: Physical examination:  Nursing notes reviewed; Vital signs and O2 SAT reviewed;  Constitutional: Well developed, Well nourished, Well hydrated, In no acute distress; Head:  Normocephalic, atraumatic; Eyes: EOMI, PERRL, No scleral icterus; ENMT: TM's clear bilat, +edemetous nasal turbinates bilat with clear rhinorrhea, Mouth and pharynx without intra-oral edema, no hoarse voice, +mild erythema to post pharynx, Mucous membranes moist; Neck: Supple, Full range of motion, No lymphadenopathy; Cardiovascular: Regular rate and rhythm, No murmur, rub, or gallop; Respiratory: Breath sounds clear & equal bilaterally, No rales, rhonchi, wheezes, or rub, Normal respiratory effort/excursion; Chest: Nontender, Movement normal; Abdomen: Soft, Nontender, Nondistended, Normal bowel sounds; Extremities: Pulses normal, No tenderness, No edema, No calf edema or asymmetry.; Neuro: AA&Ox3, Major CN grossly intact. Gait steady. No gross focal motor or sensory deficits in extremities.; Skin: Color normal, Warm, Dry, no rash.    ED Course  Procedures    MDM  MDM Reviewed: nursing note and vitals Interpretation: labs     Results for orders placed during the hospital encounter of 11/25/11  RAPID STREP SCREEN      Component Value Range   Streptococcus, Group A Screen (Direct) NEGATIVE  NEGATIVE      7:27 PM:  Appears viral illness at this time, will tx symptomatically.  Dx testing d/w pt and family.  Questions  answered.  Verb understanding, agreeable to d/c home with outpt f/u.           Laray Anger, DO 11/26/11 2102

## 2011-11-25 NOTE — ED Notes (Signed)
Flu like sx with sore throat, body aches, cough, congestion, fever x 4 days.

## 2011-11-25 NOTE — ED Notes (Signed)
Pt states has had flu like symptoms x 4 days. Pt reports low grade fever/chills, non productive cough, clear/yellowish nasal secretions, generalized body aches and sore throat.  Pt reports taking there a flu with no relief.

## 2012-04-11 ENCOUNTER — Emergency Department (HOSPITAL_COMMUNITY)
Admission: EM | Admit: 2012-04-11 | Discharge: 2012-04-11 | Disposition: A | Discharge status: Home / Self Care | Payer: Medicare Other | Attending: Emergency Medicine | Admitting: Emergency Medicine

## 2012-04-11 ENCOUNTER — Encounter (HOSPITAL_COMMUNITY): Payer: Self-pay | Admitting: *Deleted

## 2012-04-11 ENCOUNTER — Emergency Department (HOSPITAL_COMMUNITY): Payer: Medicare Other

## 2012-04-11 DIAGNOSIS — I10 Essential (primary) hypertension: Secondary | ICD-10-CM | POA: Insufficient documentation

## 2012-04-11 DIAGNOSIS — R0602 Shortness of breath: Secondary | ICD-10-CM | POA: Insufficient documentation

## 2012-04-11 DIAGNOSIS — Z87891 Personal history of nicotine dependence: Secondary | ICD-10-CM | POA: Insufficient documentation

## 2012-04-11 DIAGNOSIS — E7800 Pure hypercholesterolemia, unspecified: Secondary | ICD-10-CM | POA: Insufficient documentation

## 2012-04-11 DIAGNOSIS — Z79899 Other long term (current) drug therapy: Secondary | ICD-10-CM | POA: Insufficient documentation

## 2012-04-11 DIAGNOSIS — R079 Chest pain, unspecified: Secondary | ICD-10-CM | POA: Insufficient documentation

## 2012-04-11 MED ORDER — PREDNISONE 20 MG PO TABS
60.0000 mg | ORAL_TABLET | Freq: Once | ORAL | Status: AC
  Administered 2012-04-11: 60 mg via ORAL
  Filled 2012-04-11: qty 3

## 2012-04-11 MED ORDER — PREDNISONE 10 MG PO TABS: 20.0000 mg | ORAL_TABLET | Freq: Every day | ORAL | Status: DC

## 2012-04-11 MED ORDER — ALBUTEROL SULFATE (5 MG/ML) 0.5% IN NEBU
2.5000 mg | INHALATION_SOLUTION | Freq: Once | RESPIRATORY_TRACT | Status: AC
  Administered 2012-04-11: 2.5 mg via RESPIRATORY_TRACT
  Filled 2012-04-11: qty 0.5

## 2012-04-11 NOTE — ED Notes (Signed)
resp paged

## 2012-04-11 NOTE — ED Notes (Addendum)
Pt c/o sob x 2 wks. Pt states he woke up from his sleep tonight gasping. Pt also c/o in his left chest area. Pt also c/o being anxious.

## 2012-04-11 NOTE — Discharge Instructions (Signed)
Your xray here was normal. The treatment given to you made your breathing easier. Take all of the prednisone as directed. Follow up with your doctor.   Shortness of Breath Shortness of breath (dyspnea) is the feeling of uneasy breathing. Shortness of breath needs care right away. HOME CARE   Do not smoke.   Avoid being around chemicals that may bother your breathing (such as paint fumes or dust).   Rest as needed. Slowly begin your usual activities.   Only take medicine as told by your doctor. Inhaled medicines or oxygen might be part of your treatment.   Follow up with your doctor as told. Waiting to do so or failure to follow up could result in worsening your condition and possible disability or death.   Understand what to do or who to call if your shortness of breath gets worse.  GET HELP RIGHT AWAY IF:   You get pain in your chest, shoulders, belly (abdomen), or jaw.   You cannot stop coughing or you start wheezing.   You cough up blood or thick mucus.   You can only speak with short words.   You have a fever.   You feel your heart racing or skipping beats.   You are not breathing better when you stop and rest.   Your condition does not improve in the time expected.   You have problems with medicines.  MAKE SURE YOU:  Understand these instructions.   Will watch your condition.   Will get help right away if you are not doing well or get worse.  Document Released: 03/17/2008 Document Revised: 09/18/2011 Document Reviewed: 08/15/2009 [facility] Patient Information 2012 [place], [place].

## 2012-04-11 NOTE — ED Notes (Signed)
Pt states he feels ok until he goes to lay down and then starts feeling sob again.

## 2012-04-11 NOTE — ED Notes (Signed)
Pt stating "I just don't feel right. I feel clammy and dizzy." pt pulled o2 off. EDP notified, no orders received.

## 2012-04-11 NOTE — ED Provider Notes (Signed)
History     CSN: [id number]  Arrival date & time 04/11/12  0301   First MD Initiated Contact with Patient 04/11/12 0354      Chief Complaint  Patient presents with  . Shortness of Breath  . Chest Pain    (Consider location/radiation/quality/duration/timing/severity/associated sxs/prior treatment) HPI  Joseph Shepard is a 46 y.o. male who presents to the Emergency Department complaining of shortness of breath that woke him from sleep. He woke feeling he could not catch his breath. It was associated with anterior chest sharp pains when he tried to breath deeply.He has been experienced intermittent episodes of shortness of breath that are not related to activity for 2 weeks.  He denies recent illness, fever, chills, cough, nausea, vomiting. He took no medicines.   Past Medical History  Diagnosis Date  . Hypertension   . High cholesterol   . Sleep apnea     Past Surgical History  Procedure Date  . Back surgery   . Tonsillectomy   . Hernia repair     History reviewed. No pertinent family history.  History  Substance Use Topics  . Smoking status: Former Games developer  . Smokeless tobacco: Not on file  . Alcohol Use: No      Review of Systems  Constitutional: Negative for fever.       10 Systems reviewed and are negative for acute change except as noted in the HPI.  HENT: Negative for congestion.   Eyes: Negative for discharge and redness.  Respiratory: Negative for cough and shortness of breath.   Cardiovascular: Negative for chest pain.  Gastrointestinal: Negative for vomiting and abdominal pain.  Musculoskeletal: Negative for back pain.  Skin: Negative for rash.  Neurological: Negative for syncope, numbness and headaches.  Psychiatric/Behavioral:       No behavior change.    Allergies  Review of patient's allergies indicates no known allergies.  Home Medications   Current Outpatient Rx  Name Route Sig Dispense Refill  . AMPHETAMINE-DEXTROAMPHETAMINE 20 MG  PO TABS Oral Take 20 mg by mouth 2 (two) times daily.    . ASPIRIN 81 MG PO TABS Oral Take 81 mg by mouth daily.    Marland Kitchen HYDROCHLOROTHIAZIDE 25 MG PO TABS Oral Take 25 mg by mouth daily.      Marland Kitchen HYDROCODONE-ACETAMINOPHEN 7.5-325 MG PO TABS Oral Take 1 tablet by mouth every 6 (six) hours as needed. For pain    . LISINOPRIL 40 MG PO TABS Oral Take 40 mg by mouth daily.      Marland Kitchen PRAVASTATIN SODIUM 20 MG PO TABS Oral Take 20 mg by mouth at bedtime.       BP 133/81  Pulse 55  Temp 98.1 F (36.7 C) (Oral)  Resp 20  SpO2 96%  Physical Exam  Nursing note and vitals reviewed. Constitutional: He is oriented to person, place, and time.       Awake, alert, nontoxic appearance, anxious  HENT:  Head: Normocephalic and atraumatic.  Right Ear: External ear normal.  Left Ear: External ear normal.  Mouth/Throat: Oropharynx is clear and moist.  Eyes: Right eye exhibits no discharge. Left eye exhibits no discharge.  Neck: Normal range of motion. Neck supple.  Cardiovascular: Normal rate and normal heart sounds.   Pulmonary/Chest: Effort normal and breath sounds normal. He exhibits no tenderness.  Abdominal: Soft. Bowel sounds are normal. There is no tenderness. There is no rebound.  Musculoskeletal: Normal range of motion. He exhibits no tenderness.  Baseline ROM, no obvious new focal weakness.  Neurological: He is alert and oriented to person, place, and time.       Mental status and motor strength appears baseline for patient and situation.  Skin: No rash noted.  Psychiatric:       anxious    ED Course  Procedures (including critical care time)   Date: 04/11/2012  0314  Rate:56  Rhythm: sinus bradycardia and sinus arrhythmia  QRS Axis: normal  Intervals: normal  ST/T Wave abnormalities: normal  Conduction Disutrbances:none  Narrative Interpretation:   Old EKG Reviewed: unchanged c/w 07/31/2011 Dg Chest 2 View  04/11/2012  *RADIOLOGY REPORT*  Clinical Data: Shortness of breath  CHEST -  2 VIEW  Comparison: 07/31/2011  Findings: Lungs are clear. No pleural effusion or pneumothorax. The cardiomediastinal contours are within normal limits. The visualized bones and soft tissues are without significant appreciable abnormality.  IMPRESSION: No radiographic evidence of acute cardiopulmonary process.  Original Report Authenticated By: Waneta Martins, M.D.     MDM  Patient with sudden onset of shortness of breath that awakened him from sleep.O2 sats on RA 96-98%. PE lung exam was normal. EKG and chest xray without acute findings. Patient received albuterol nebulized treatment  And prednisone after which he felt more anxious. Pulse ox remained 98%. He was ambulated in the depart emtn without symptoms and with O2 sats remaining above 96% on RA.Pt stable in ED with no significant deterioration in condition.The patient appears reasonably screened and/or stabilized for discharge and I doubt any other medical condition or other [facility] requiring further screening, evaluation, or treatment in the ED at this time prior to discharge.  MDM Reviewed: nursing note and vitals Interpretation: x-ray           Nicoletta Dress. Colon Branch, MD 04/11/12 2052

## 2012-05-04 ENCOUNTER — Emergency Department (HOSPITAL_COMMUNITY)
Admission: EM | Admit: 2012-05-04 | Discharge: 2012-05-04 | Disposition: A | Discharge status: Home / Self Care | Payer: Medicare Other | Attending: Emergency Medicine | Admitting: Emergency Medicine

## 2012-05-04 ENCOUNTER — Encounter (HOSPITAL_COMMUNITY): Payer: Self-pay | Admitting: Emergency Medicine

## 2012-05-04 DIAGNOSIS — M549 Dorsalgia, unspecified: Secondary | ICD-10-CM | POA: Insufficient documentation

## 2012-05-04 DIAGNOSIS — E7800 Pure hypercholesterolemia, unspecified: Secondary | ICD-10-CM | POA: Insufficient documentation

## 2012-05-04 DIAGNOSIS — Z7982 Long term (current) use of aspirin: Secondary | ICD-10-CM | POA: Insufficient documentation

## 2012-05-04 DIAGNOSIS — R109 Unspecified abdominal pain: Secondary | ICD-10-CM

## 2012-05-04 DIAGNOSIS — G4730 Sleep apnea, unspecified: Secondary | ICD-10-CM | POA: Insufficient documentation

## 2012-05-04 DIAGNOSIS — I10 Essential (primary) hypertension: Secondary | ICD-10-CM | POA: Insufficient documentation

## 2012-05-04 DIAGNOSIS — Z79899 Other long term (current) drug therapy: Secondary | ICD-10-CM | POA: Insufficient documentation

## 2012-05-04 LAB — URINALYSIS, ROUTINE W REFLEX MICROSCOPIC
Bilirubin Urine: NEGATIVE
Glucose, UA: NEGATIVE mg/dL
Hgb urine dipstick: NEGATIVE
Protein, ur: NEGATIVE mg/dL
Specific Gravity, Urine: 1.015 (ref 1.005–1.030)
Urobilinogen, UA: 0.2 mg/dL (ref 0.0–1.0)

## 2012-05-04 LAB — CBC WITH DIFFERENTIAL/PLATELET
Eosinophils Absolute: 0.1 10*3/uL (ref 0.0–0.7)
Eosinophils Relative: 1 % (ref 0–5)
HCT: 37.2 % — ABNORMAL LOW (ref 39.0–52.0)
Hemoglobin: 13.1 g/dL (ref 13.0–17.0)
Lymphocytes Relative: 26 % (ref 12–46)
Lymphs Abs: 3.2 10*3/uL (ref 0.7–4.0)
MCH: 28.2 pg (ref 26.0–34.0)
MCV: 80 fL (ref 78.0–100.0)
Monocytes Relative: 8 % (ref 3–12)
RBC: 4.65 MIL/uL (ref 4.22–5.81)
WBC: 12.3 10*3/uL — ABNORMAL HIGH (ref 4.0–10.5)

## 2012-05-04 LAB — BASIC METABOLIC PANEL
BUN: 13 mg/dL (ref 6–23)
CO2: 28 mEq/L (ref 19–32)
Calcium: 10.3 mg/dL (ref 8.4–10.5)
GFR calc non Af Amer: 86 mL/min — ABNORMAL LOW (ref >90)
Glucose, Bld: 108 mg/dL — ABNORMAL HIGH (ref 70–99)

## 2012-05-04 LAB — HEPATIC FUNCTION PANEL
ALT: 96 U/L — ABNORMAL HIGH (ref 0–53)
AST: 39 U/L — ABNORMAL HIGH (ref 0–37)
Alkaline Phosphatase: 51 U/L (ref 39–117)
Indirect Bilirubin: 0.4 mg/dL (ref 0.3–0.9)
Total Protein: 7.5 g/dL (ref 6.0–8.3)

## 2012-05-04 LAB — LIPASE, BLOOD: Lipase: 26 U/L (ref 11–59)

## 2012-05-04 MED ORDER — HYDROMORPHONE HCL PF 1 MG/ML IJ SOLN
1.0000 mg | Freq: Once | INTRAMUSCULAR | Status: AC
  Administered 2012-05-04: 1 mg via INTRAVENOUS
  Filled 2012-05-04: qty 1

## 2012-05-04 MED ORDER — HYDROMORPHONE HCL PF 1 MG/ML IJ SOLN
1.0000 mg | INTRAMUSCULAR | Status: DC | PRN
  Administered 2012-05-04: 1 mg via INTRAVENOUS
  Filled 2012-05-04: qty 1

## 2012-05-04 MED ORDER — SODIUM CHLORIDE 0.9 % IV SOLN
Freq: Once | INTRAVENOUS | Status: AC
  Administered 2012-05-04: 12:00:00 via INTRAVENOUS

## 2012-05-04 MED ORDER — ONDANSETRON HCL 4 MG/2ML IJ SOLN
4.0000 mg | Freq: Once | INTRAMUSCULAR | Status: AC
  Administered 2012-05-04: 4 mg via INTRAVENOUS
  Filled 2012-05-04: qty 2

## 2012-05-04 NOTE — ED Notes (Signed)
EDP at bedside  

## 2012-05-04 NOTE — ED Notes (Signed)
Pt presents to ED c/o abdominal pain and back pain. 8/10 on pain scale. Pt reports was d/c from [facility] 7/22 after admit for kidney stones. Pt reports has not passed dx kidney stones as of this time. Pt c/o nausea today, reports vomiting passed 3 days.

## 2012-05-04 NOTE — ED Provider Notes (Signed)
History     CSN: [id number]  Arrival date & time 05/04/12  1158   First MD Initiated Contact with Patient 05/04/12 1304      Chief Complaint  Patient presents with  . Abdominal Pain  . Back Pain     Patient is a 46 y.o. male presenting with abdominal pain. The history is provided by the patient.  Abdominal Pain The primary symptoms of the illness include abdominal pain, nausea and vomiting. The primary symptoms of the illness do not include diarrhea. The current episode started more than 2 days ago. The onset of the illness was gradual. The problem has been gradually worsening.  Additional symptoms associated with the illness include chills and back pain.  pt reports back pain and abdominal pain for past several days.  He reports recent admission to [facility], was told he had a kidney stone but he was not given the option to extract the stone.   He reports the pain has continued - it is in his back and diffusely in his abdomen He reports nausea/vomiting but no diarrhea or change in bowel habits He has no groin/testicular tenderness.   No cp is reported No fever is reported  Past Medical History  Diagnosis Date  . Hypertension   . High cholesterol   . Sleep apnea     Past Surgical History  Procedure Date  . Back surgery   . Tonsillectomy   . Hernia repair     No family history on file.  History  Substance Use Topics  . Smoking status: Former Games developer  . Smokeless tobacco: Not on file  . Alcohol Use: No      Review of Systems  Constitutional: Positive for chills.  Gastrointestinal: Positive for nausea, vomiting and abdominal pain. Negative for diarrhea.  Musculoskeletal: Positive for back pain.  All other systems reviewed and are negative.    Allergies  Review of patient's allergies indicates no known allergies.  Home Medications   Current Outpatient Rx  Name Route Sig Dispense Refill  . AMPHETAMINE-DEXTROAMPHETAMINE 20 MG PO TABS Oral Take 20 mg  by mouth 2 (two) times daily.    . ASPIRIN 81 MG PO TABS Oral Take 81 mg by mouth daily.    Marland Kitchen HYDROCHLOROTHIAZIDE 25 MG PO TABS Oral Take 25 mg by mouth daily.      Marland Kitchen HYDROCODONE-ACETAMINOPHEN 7.5-325 MG PO TABS Oral Take 1 tablet by mouth every 6 (six) hours as needed. For pain    . LISINOPRIL 40 MG PO TABS Oral Take 40 mg by mouth daily.      Marland Kitchen PRAVASTATIN SODIUM 20 MG PO TABS Oral Take 20 mg by mouth at bedtime.       BP 149/94  Pulse 55  Temp 98.2 F (36.8 C) (Oral)  Resp 20  Ht 5\' 9"  (1.753 m)  Wt 280 lb (127.007 kg)  BMI 41.35 kg/m2  SpO2 97%  Physical Exam CONSTITUTIONAL: Well developed/well nourished HEAD AND FACE: Normocephalic/atraumatic EYES: EOMI/PERRL ENMT: Mucous membranes moist NECK: supple no meningeal signs SPINE:entire spine nontender CV: S1/S2 noted, no murmurs/rubs/gallops noted LUNGS: Lungs are clear to auscultation bilaterally, no apparent distress ABDOMEN: soft, mild diffuse tenderness, no rebound or guarding GU:no cva tenderness NEURO: Pt is awake/alert, moves all extremitiesx4 EXTREMITIES: pulses normal, full ROM SKIN: warm, color normal PSYCH: no abnormalities of mood noted  ED Course  Procedures   Labs Reviewed  CBC WITH DIFFERENTIAL - Abnormal; Notable for the following:    WBC 12.3 (*)  HCT 37.2 (*)     Neutro Abs 8.0 (*)     All other components within normal limits  BASIC METABOLIC PANEL - Abnormal; Notable for the following:    Potassium 3.3 (*)     Glucose, Bld 108 (*)     GFR calc non Af Amer 86 (*)     All other components within normal limits  URINALYSIS, ROUTINE W REFLEX MICROSCOPIC  LIPASE, BLOOD  HEPATIC FUNCTION PANEL   1:48 PM Records from [facility] requested.  Pt currently stable.  Reports continuation of pain that was present while at [facility] 2:32 PM [place] records: He had distal right ureteral stone (6mm) with mild hydro Discharged on 7/22 WBC at 15.6 on that admission and last creatinine was 1.2 Pt improved  on admission, CT scan showed stone and then repeat US showed resolution of hydronephrosis  Pt well appearing, abdomen soft without focal tenderness.   Suspicion for acute abdominal process is low   MDM  Nursing notes including past medical history and social history reviewed and considered in documentation All labs/vitals reviewed and considered     Date: 05/04/2012  Rate: 46  Rhythm: sinus bradycardia  QRS Axis: normal  Intervals: normal  ST/T Wave abnormalities: normal  Conduction Disutrbances:none  Narrative Interpretation:   Old EKG Reviewed: unchanged  \     Joya Gaskins, MD 05/04/12 1540

## 2012-08-16 ENCOUNTER — Other Ambulatory Visit: Payer: Self-pay

## 2012-08-16 ENCOUNTER — Other Ambulatory Visit (HOSPITAL_COMMUNITY): Payer: Self-pay | Admitting: Family Medicine

## 2012-08-16 DIAGNOSIS — R1013 Epigastric pain: Secondary | ICD-10-CM

## 2012-08-16 DIAGNOSIS — M545 Low back pain, unspecified: Secondary | ICD-10-CM

## 2012-08-19 ENCOUNTER — Ambulatory Visit (HOSPITAL_COMMUNITY)
Admission: RE | Admit: 2012-08-19 | Discharge: 2012-08-19 | Disposition: A | Discharge status: Home / Self Care | Payer: Medicare Other | Source: Ambulatory Visit | Attending: Family Medicine | Admitting: Family Medicine

## 2012-08-19 ENCOUNTER — Other Ambulatory Visit (HOSPITAL_COMMUNITY): Payer: Self-pay | Admitting: Family Medicine

## 2012-08-19 DIAGNOSIS — F40298 Other specified phobia: Secondary | ICD-10-CM | POA: Insufficient documentation

## 2012-08-19 DIAGNOSIS — M545 Low back pain, unspecified: Secondary | ICD-10-CM

## 2012-08-19 DIAGNOSIS — M5126 Other intervertebral disc displacement, lumbar region: Secondary | ICD-10-CM | POA: Insufficient documentation

## 2012-08-19 LAB — POCT I-STAT, CHEM 8
BUN: 17 mg/dL (ref 6–23)
Calcium, Ion: 1.14 mmol/L (ref 1.12–1.23)
Chloride: 108 mEq/L (ref 96–112)
Glucose, Bld: 114 mg/dL — ABNORMAL HIGH (ref 70–99)
TCO2: 22 mmol/L (ref 0–100)

## 2012-08-19 NOTE — Progress Notes (Signed)
Blood sample obtained from right arm IV for Creatnine level.  

## 2012-08-20 ENCOUNTER — Inpatient Hospital Stay (HOSPITAL_COMMUNITY): Admission: RE | Admit: 2012-08-20 | Payer: Medicare Other | Source: Ambulatory Visit

## 2012-12-01 ENCOUNTER — Emergency Department (HOSPITAL_COMMUNITY)
Admission: EM | Admit: 2012-12-01 | Discharge: 2012-12-02 | Disposition: A | Discharge status: Home / Self Care | Payer: Medicare Other | Attending: Emergency Medicine | Admitting: Emergency Medicine

## 2012-12-01 ENCOUNTER — Encounter (HOSPITAL_COMMUNITY): Payer: Self-pay | Admitting: Emergency Medicine

## 2012-12-01 DIAGNOSIS — I10 Essential (primary) hypertension: Secondary | ICD-10-CM | POA: Insufficient documentation

## 2012-12-01 DIAGNOSIS — Z79899 Other long term (current) drug therapy: Secondary | ICD-10-CM | POA: Insufficient documentation

## 2012-12-01 DIAGNOSIS — G4730 Sleep apnea, unspecified: Secondary | ICD-10-CM | POA: Insufficient documentation

## 2012-12-01 DIAGNOSIS — W1809XA Striking against other object with subsequent fall, initial encounter: Secondary | ICD-10-CM | POA: Insufficient documentation

## 2012-12-01 DIAGNOSIS — Y9301 Activity, walking, marching and hiking: Secondary | ICD-10-CM | POA: Insufficient documentation

## 2012-12-01 DIAGNOSIS — IMO0002 Reserved for concepts with insufficient information to code with codable children: Secondary | ICD-10-CM | POA: Insufficient documentation

## 2012-12-01 DIAGNOSIS — Y929 Unspecified place or not applicable: Secondary | ICD-10-CM | POA: Insufficient documentation

## 2012-12-01 DIAGNOSIS — W19XXXA Unspecified fall, initial encounter: Secondary | ICD-10-CM

## 2012-12-01 DIAGNOSIS — Z87891 Personal history of nicotine dependence: Secondary | ICD-10-CM | POA: Insufficient documentation

## 2012-12-01 DIAGNOSIS — S0993XA Unspecified injury of face, initial encounter: Secondary | ICD-10-CM | POA: Insufficient documentation

## 2012-12-01 DIAGNOSIS — E7800 Pure hypercholesterolemia, unspecified: Secondary | ICD-10-CM | POA: Insufficient documentation

## 2012-12-01 DIAGNOSIS — Z7982 Long term (current) use of aspirin: Secondary | ICD-10-CM | POA: Insufficient documentation

## 2012-12-01 MED ORDER — ONDANSETRON 4 MG PO TBDP
4.0000 mg | ORAL_TABLET | Freq: Once | ORAL | Status: AC
  Administered 2012-12-01: 4 mg via ORAL
  Filled 2012-12-01: qty 1

## 2012-12-01 MED ORDER — IBUPROFEN 800 MG PO TABS
800.0000 mg | ORAL_TABLET | Freq: Once | ORAL | Status: AC
  Administered 2012-12-01: 800 mg via ORAL
  Filled 2012-12-01: qty 1

## 2012-12-01 NOTE — ED Notes (Signed)
Patient states fell while walking earlier today; states was seen at [facility] and was discharged.  Patient c/o continued pain to head, neck and back.  Patient c/o feeling uneasy and dizzy.

## 2012-12-01 NOTE — ED Provider Notes (Signed)
History     This chart was scribed for [employer]. Colon Branch, MD, MD by Burman Nieves ED Scribe. The patient was seen in room APA02/APA02 and the patient's care was started at 11:25 PM.   CSN: [id number]  Arrival date & time 12/01/12  1929    Chief Complaint  Patient presents with  . Fall  . Back Pain  . Headache  . Neck Pain    (Consider location/radiation/quality/duration/timing/severity/associated sxs/prior treatment) The history is provided by the patient and medical records. No language interpreter was used.   Joseph Shepard is a 47 y.o. male who presents to the Emergency Department complaining of moderate constant headache onset today. Pt fell on ice then was seen at hospital in [place].Pt complains of associated neck and back pain.  Pt had x-rays with normal results. He reports of he was sent home with loratab, ibuprofen, and flexeril. He denied any pain medications prescribed. Pt denies LOC, fever, chills, cough, nausea, vomiting, diarrhea, SOB, weakness, and any other associated symptoms.   Past Medical History  Diagnosis Date  . Hypertension   . High cholesterol   . Sleep apnea     Past Surgical History  Procedure Laterality Date  . Back surgery    . Tonsillectomy    . Hernia repair      No family history on file.  History  Substance Use Topics  . Smoking status: Former Games developer  . Smokeless tobacco: Not on file  . Alcohol Use: No      Review of Systems  10 Systems reviewed and all are negative for acute change except as noted in the HPI.   Allergies  Review of patient's allergies indicates no known allergies.  Home Medications   Current Outpatient Rx  Name  Route  Sig  Dispense  Refill  . amphetamine-dextroamphetamine (ADDERALL) 20 MG tablet   Oral   Take 20 mg by mouth 2 (two) times daily.         Marland Kitchen aspirin 81 MG tablet   Oral   Take 81 mg by mouth daily.         . hydrochlorothiazide (HYDRODIURIL) 25 MG tablet   Oral   Take 25 mg by  mouth daily.           Marland Kitchen HYDROcodone-acetaminophen (NORCO) 10-325 MG per tablet   Oral   Take 1 tablet by mouth every 6 (six) hours as needed for pain.         Marland Kitchen HYDROcodone-acetaminophen (NORCO) 7.5-325 MG per tablet   Oral   Take 1 tablet by mouth every 6 (six) hours as needed. For pain         . lisinopril (PRINIVIL,ZESTRIL) 40 MG tablet   Oral   Take 40 mg by mouth daily.           . metFORMIN (GLUCOPHAGE) 500 MG tablet   Oral   Take 500 mg by mouth every evening.         . pravastatin (PRAVACHOL) 20 MG tablet   Oral   Take 20 mg by mouth at bedtime.            BP 114/59  Pulse 57  Temp(Src) 98.1 F (36.7 C) (Oral)  Resp 20  Ht 5\' 9"  (1.753 m)  Wt 270 lb (122.471 kg)  BMI 39.85 kg/m2  SpO2 97%  Physical Exam  Nursing note and vitals reviewed. Constitutional: He is oriented to person, place, and time. He appears well-developed and well-nourished. No distress.  HENT:  Head: Normocephalic and atraumatic.  Eyes: EOM are normal.  Neck: Neck supple. No tracheal deviation present.  Cardiovascular: Normal rate and regular rhythm.   Pulmonary/Chest: Effort normal. No respiratory distress.  Musculoskeletal: Normal range of motion.  Neurological: He is alert and oriented to person, place, and time.  Skin: Skin is warm and dry.  Psychiatric: He has a normal mood and affect. His behavior is normal.    ED Course  Procedures (including critical care time)  DIAGNOSTIC STUDIES: Oxygen Saturation is 98% on room air, normal by my interpretation.         COORDINATION OF CARE:  11:29 PM Discussed ED treatment with pt and pt agrees. Ordered ibuprofen and zofran for pt.       1223 Reviewed previous chart from [place].   MDM  Patient with fall on the ice. Seen at [facility], films done. Home with flexeril, hydrocodone, ibuprofen. Given i buprofen and zofran. Pt stable in ED with no significant deterioration in condition.The patient appears  reasonably screened and/or stabilized for discharge and I doubt any other medical condition or other [facility] requiring further screening, evaluation, or treatment in the ED at this time prior to discharge.   I personally performed the services described in this documentation, which was scribed in my presence. The recorded information has been reviewed and considered.  MDM Reviewed: nursing note, vitals and previous chart Reviewed previous: labs, x-ray and CT scan Interpretation: labs           Nicoletta Dress. Colon Branch, MD 12/02/12 0110

## 2012-12-02 MED ORDER — ONDANSETRON 4 MG PO TBDP: 4.0000 mg | ORAL_TABLET | Freq: Three times a day (TID) | ORAL | Status: DC | PRN

## 2012-12-02 NOTE — ED Notes (Signed)
Discharge instructions reviewed with pt, questions answered. Pt verbalized understanding.  

## 2013-02-05 ENCOUNTER — Emergency Department (HOSPITAL_COMMUNITY)
Admission: EM | Admit: 2013-02-05 | Discharge: 2013-02-05 | Disposition: A | Discharge status: Home / Self Care | Payer: Medicare Other | Attending: Emergency Medicine | Admitting: Emergency Medicine

## 2013-02-05 ENCOUNTER — Encounter (HOSPITAL_COMMUNITY): Payer: Self-pay | Admitting: *Deleted

## 2013-02-05 ENCOUNTER — Emergency Department (HOSPITAL_COMMUNITY): Payer: Medicare Other

## 2013-02-05 DIAGNOSIS — R319 Hematuria, unspecified: Secondary | ICD-10-CM

## 2013-02-05 DIAGNOSIS — Z8669 Personal history of other diseases of the nervous system and sense organs: Secondary | ICD-10-CM | POA: Insufficient documentation

## 2013-02-05 DIAGNOSIS — Z7982 Long term (current) use of aspirin: Secondary | ICD-10-CM | POA: Insufficient documentation

## 2013-02-05 DIAGNOSIS — M549 Dorsalgia, unspecified: Secondary | ICD-10-CM | POA: Insufficient documentation

## 2013-02-05 DIAGNOSIS — R109 Unspecified abdominal pain: Secondary | ICD-10-CM

## 2013-02-05 DIAGNOSIS — R110 Nausea: Secondary | ICD-10-CM | POA: Insufficient documentation

## 2013-02-05 DIAGNOSIS — I10 Essential (primary) hypertension: Secondary | ICD-10-CM | POA: Insufficient documentation

## 2013-02-05 DIAGNOSIS — E7800 Pure hypercholesterolemia, unspecified: Secondary | ICD-10-CM | POA: Insufficient documentation

## 2013-02-05 DIAGNOSIS — R34 Anuria and oliguria: Secondary | ICD-10-CM | POA: Insufficient documentation

## 2013-02-05 DIAGNOSIS — Z87442 Personal history of urinary calculi: Secondary | ICD-10-CM | POA: Insufficient documentation

## 2013-02-05 DIAGNOSIS — Z87891 Personal history of nicotine dependence: Secondary | ICD-10-CM | POA: Insufficient documentation

## 2013-02-05 DIAGNOSIS — Z9889 Other specified postprocedural states: Secondary | ICD-10-CM | POA: Insufficient documentation

## 2013-02-05 DIAGNOSIS — Z79899 Other long term (current) drug therapy: Secondary | ICD-10-CM | POA: Insufficient documentation

## 2013-02-05 HISTORY — DX: Calculus of kidney: N20.0

## 2013-02-05 LAB — URINALYSIS, ROUTINE W REFLEX MICROSCOPIC
Bilirubin Urine: NEGATIVE
Glucose, UA: NEGATIVE mg/dL
Ketones, ur: NEGATIVE mg/dL
Protein, ur: NEGATIVE mg/dL
pH: 6 (ref 5.0–8.0)

## 2013-02-05 MED ORDER — HYDROMORPHONE HCL PF 1 MG/ML IJ SOLN
1.0000 mg | Freq: Once | INTRAMUSCULAR | Status: AC
Start: 2013-02-05 — End: 2013-02-05
  Administered 2013-02-05: 1 mg via INTRAVENOUS
  Filled 2013-02-05: qty 1

## 2013-02-05 MED ORDER — ONDANSETRON HCL 4 MG/2ML IJ SOLN
4.0000 mg | Freq: Once | INTRAMUSCULAR | Status: AC
  Administered 2013-02-05: 4 mg via INTRAVENOUS
  Filled 2013-02-05: qty 2

## 2013-02-05 MED ORDER — SODIUM CHLORIDE 0.9 % IV BOLUS (SEPSIS)
1000.0000 mL | Freq: Once | INTRAVENOUS | Status: AC
  Administered 2013-02-05: 1000 mL via INTRAVENOUS

## 2013-02-05 MED ORDER — KETOROLAC TROMETHAMINE 30 MG/ML IJ SOLN
30.0000 mg | Freq: Once | INTRAMUSCULAR | Status: AC
  Administered 2013-02-05: 30 mg via INTRAVENOUS
  Filled 2013-02-05: qty 1

## 2013-02-05 MED ORDER — HYDROCODONE-ACETAMINOPHEN 10-325 MG PO TABS: 1.0000 | ORAL_TABLET | Freq: Four times a day (QID) | ORAL | Status: DC | PRN

## 2013-02-05 NOTE — ED Provider Notes (Signed)
History    This chart was scribed for Gerhard Munch, MD, by Frederik Pear, ED scribe. The patient was seen in room APA18/APA18 and the patient's care was started at 1439.    CSN: [id number]  Arrival date & time 02/05/13  1336   First MD Initiated Contact with Patient 02/05/13 1439      Chief Complaint  Patient presents with  . Flank Pain    (Consider location/radiation/quality/duration/timing/severity/associated sxs/prior treatment) The history is provided by the patient and medical records. No language interpreter was used.   Joseph Shepard is a 47 y.o. male with a h/o of nephrolithiasis 1x who presents to the Emergency Department complaining of sudden onset, constant, 8/10 right flank pain with associated generalized abdominal pain, diminished urination, and nausea that began 4 days ago. He denies any confusion, disorientation, emesis, fever, or rash. He reports that he treated that symptoms at home by drinking large quantities of cranberry juice and lemonade and taking his Norco that is prescribed for his chronic back pain without relief. He denies any h/o of a lithotripsy. He reports that he is a current nonsmoker for the past 7 years, but uses marijuana recreationally.   No urologist.   Past Medical History  Diagnosis Date  . Hypertension   . High cholesterol   . Sleep apnea   . Kidney stones     Past Surgical History  Procedure Laterality Date  . Back surgery    . Tonsillectomy    . Hernia repair      No family history on file.  History  Substance Use Topics  . Smoking status: Former Games developer  . Smokeless tobacco: Not on file  . Alcohol Use: No      Review of Systems A complete 10 system review of systems was obtained and all systems are negative except as noted in the HPI and PMH.  Allergies  Review of patient's allergies indicates no known allergies.  Home Medications   Current Outpatient Rx  Name  Route  Sig  Dispense  Refill  .  amphetamine-dextroamphetamine (ADDERALL) 20 MG tablet   Oral   Take 20 mg by mouth 2 (two) times daily.         Marland Kitchen aspirin EC 81 MG tablet   Oral   Take 81 mg by mouth daily.         . hydrochlorothiazide (HYDRODIURIL) 25 MG tablet   Oral   Take 25 mg by mouth daily.           Marland Kitchen HYDROcodone-acetaminophen (NORCO) 10-325 MG per tablet   Oral   Take 1 tablet by mouth every 6 (six) hours as needed for pain.         Marland Kitchen lisinopril (PRINIVIL,ZESTRIL) 40 MG tablet   Oral   Take 40 mg by mouth daily.           . metFORMIN (GLUCOPHAGE) 500 MG tablet   Oral   Take 500 mg by mouth every evening.         . ondansetron (ZOFRAN ODT) 4 MG disintegrating tablet   Oral   Take 1 tablet (4 mg total) by mouth every 8 (eight) hours as needed for nausea.   20 tablet   0   . pravastatin (PRAVACHOL) 20 MG tablet   Oral   Take 20 mg by mouth at bedtime.            BP 128/78  Pulse 87  Temp(Src) 98.3 F (36.8 C) (Oral)  Resp  20  Ht 5\' 9"  (1.753 m)  Wt 280 lb (127.007 kg)  BMI 41.33 kg/m2  SpO2 100%  Physical Exam  Nursing note and vitals reviewed. Constitutional: He is oriented to person, place, and time. He appears well-developed. No distress.  HENT:  Head: Normocephalic and atraumatic.  Eyes: Conjunctivae and EOM are normal.  Cardiovascular: Normal rate, regular rhythm and normal heart sounds.   No murmur heard. Pulmonary/Chest: Effort normal and breath sounds normal. No stridor. No respiratory distress. He has no wheezes. He has no rales. He exhibits no tenderness.  Abdominal: Soft. He exhibits no distension. There is tenderness.  Periumbilical and RUQ tenderness  Musculoskeletal: He exhibits tenderness. He exhibits no edema.  Right CVA tenderness.  Neurological: He is alert and oriented to person, place, and time.  Skin: Skin is warm and dry.  Psychiatric: He has a normal mood and affect.    ED Course  Procedures (including critical care time)  COORDINATION OF  CARE:  15:02- Discussed planned course of treatment with the patient, including a UA, Dilaudid, Toradol, Zofran, and an abdominal pelvis CT without contrast, who is agreeable at this time.  15:15- Medication Orders- hydromorphone (dilaudid) injection 1 mg- once, ketorolac (toradol) 30 mg/ml injection 30 mg- once, ondansetron (zofran) injection 4 mg- once.  Results for orders placed during the hospital encounter of 02/05/13  URINALYSIS, ROUTINE W REFLEX MICROSCOPIC      Result Value Range   Color, Urine YELLOW  YELLOW   APPearance CLEAR  CLEAR   Specific Gravity, Urine 1.010  1.005 - 1.030   pH 6.0  5.0 - 8.0   Glucose, UA NEGATIVE  NEGATIVE mg/dL   Hgb urine dipstick TRACE (*) NEGATIVE   Bilirubin Urine NEGATIVE  NEGATIVE   Ketones, ur NEGATIVE  NEGATIVE mg/dL   Protein, ur NEGATIVE  NEGATIVE mg/dL   Urobilinogen, UA 0.2  0.0 - 1.0 mg/dL   Nitrite NEGATIVE  NEGATIVE   Leukocytes, UA NEGATIVE  NEGATIVE  URINE MICROSCOPIC-ADD ON      Result Value Range   WBC, UA 3-6  <3 WBC/hpf   RBC / HPF 3-6  <3 RBC/hpf    Labs Reviewed  URINALYSIS, ROUTINE W REFLEX MICROSCOPIC - Abnormal; Notable for the following:    Hgb urine dipstick TRACE (*)    All other components within normal limits  URINE MICROSCOPIC-ADD ON   No results found.   No diagnosis found.    MDM   I personally performed the services described in this documentation, which was scribed in my presence. The recorded information has been reviewed and is accurate.   This patient with a history of kidney stones presents with new low back pain.  On exam the patient is in no distress, clinically stable.  Patient's urinalysis demonstrates blood, consistent with concern for kidney stone.  However, the patient's CT scan was largely unremarkable.  The patient was counseled on the possibilities for his pain, including passed stone, ureteral spasm, musculoskeletal etiology.  His discharged in stable condition to follow up his primary  care physician and a urologist.  Abs distress, fever, there is low suspicion for acute occult pathology.  Gerhard Munch, MD 02/05/13 1921

## 2013-02-05 NOTE — ED Notes (Signed)
Pt c/o lower back pain that is worse on right side that started Tuesday, associated with nausea, hx of kidney stones

## 2013-02-09 ENCOUNTER — Other Ambulatory Visit (HOSPITAL_COMMUNITY): Payer: Self-pay | Admitting: Urology

## 2013-02-09 DIAGNOSIS — R39198 Other difficulties with micturition: Secondary | ICD-10-CM

## 2013-02-14 ENCOUNTER — Ambulatory Visit (HOSPITAL_COMMUNITY)
Admission: RE | Admit: 2013-02-14 | Discharge: 2013-02-14 | Disposition: A | Discharge status: Home / Self Care | Payer: Medicare Other | Source: Ambulatory Visit | Attending: Urology | Admitting: Urology

## 2013-02-14 DIAGNOSIS — R109 Unspecified abdominal pain: Secondary | ICD-10-CM | POA: Insufficient documentation

## 2013-02-14 DIAGNOSIS — K769 Liver disease, unspecified: Secondary | ICD-10-CM | POA: Insufficient documentation

## 2013-02-14 DIAGNOSIS — R3911 Hesitancy of micturition: Secondary | ICD-10-CM | POA: Insufficient documentation

## 2013-02-14 DIAGNOSIS — R3989 Other symptoms and signs involving the genitourinary system: Secondary | ICD-10-CM | POA: Insufficient documentation

## 2013-02-14 DIAGNOSIS — R39198 Other difficulties with micturition: Secondary | ICD-10-CM

## 2014-08-11 ENCOUNTER — Encounter (HOSPITAL_COMMUNITY): Payer: Self-pay | Admitting: Emergency Medicine

## 2014-08-11 ENCOUNTER — Emergency Department (HOSPITAL_COMMUNITY): Payer: Medicare Other

## 2014-08-11 ENCOUNTER — Emergency Department (HOSPITAL_COMMUNITY)
Admission: EM | Admit: 2014-08-11 | Discharge: 2014-08-11 | Disposition: A | Discharge status: Home / Self Care | Payer: Medicare Other | Attending: Emergency Medicine | Admitting: Emergency Medicine

## 2014-08-11 DIAGNOSIS — M7531 Calcific tendinitis of right shoulder: Secondary | ICD-10-CM

## 2014-08-11 DIAGNOSIS — E780 Pure hypercholesterolemia: Secondary | ICD-10-CM | POA: Insufficient documentation

## 2014-08-11 DIAGNOSIS — Z8669 Personal history of other diseases of the nervous system and sense organs: Secondary | ICD-10-CM | POA: Insufficient documentation

## 2014-08-11 DIAGNOSIS — W010XXA Fall on same level from slipping, tripping and stumbling without subsequent striking against object, initial encounter: Secondary | ICD-10-CM | POA: Insufficient documentation

## 2014-08-11 DIAGNOSIS — Z87442 Personal history of urinary calculi: Secondary | ICD-10-CM | POA: Diagnosis not present

## 2014-08-11 DIAGNOSIS — Z87891 Personal history of nicotine dependence: Secondary | ICD-10-CM | POA: Diagnosis not present

## 2014-08-11 DIAGNOSIS — S4991XA Unspecified injury of right shoulder and upper arm, initial encounter: Secondary | ICD-10-CM | POA: Diagnosis present

## 2014-08-11 DIAGNOSIS — I10 Essential (primary) hypertension: Secondary | ICD-10-CM | POA: Insufficient documentation

## 2014-08-11 DIAGNOSIS — M25511 Pain in right shoulder: Secondary | ICD-10-CM

## 2014-08-11 DIAGNOSIS — Z79899 Other long term (current) drug therapy: Secondary | ICD-10-CM | POA: Diagnosis not present

## 2014-08-11 DIAGNOSIS — Z7982 Long term (current) use of aspirin: Secondary | ICD-10-CM | POA: Diagnosis not present

## 2014-08-11 MED ORDER — KETOROLAC TROMETHAMINE 60 MG/2ML IM SOLN
60.0000 mg | Freq: Once | INTRAMUSCULAR | Status: AC
  Administered 2014-08-11: 60 mg via INTRAMUSCULAR
  Filled 2014-08-11: qty 2

## 2014-08-11 MED ORDER — METHOCARBAMOL 500 MG PO TABS
1000.0000 mg | ORAL_TABLET | Freq: Once | ORAL | Status: AC
  Administered 2014-08-11: 1000 mg via ORAL
  Filled 2014-08-11: qty 2

## 2014-08-11 MED ORDER — IBUPROFEN 600 MG PO TABS: 600.0000 mg | ORAL_TABLET | Freq: Four times a day (QID) | ORAL | Status: DC | PRN

## 2014-08-11 NOTE — Discharge Instructions (Signed)
Calcific Tendinitis  Calcific tendinitis occurs when crystals of calcium are deposited in a tendon. Tendons are bands of strong, fibrous tissue that attach muscles to bones. Tendons are an important part of joints. They make the joint move and they absorb some of the stress that a joint receives during use. When calcium is deposited in the tendon, the tendon becomes stiff, painful, and it can become swollen. Calcific tendinitis occurs frequently in the shoulder joint, in a structure called the rotator cuff.  CAUSES   The cause of calcific tendinitis is unclear. It may be associated with:  · Overuse of the tendon, such as from repetitive motion.  · Excess stress on the tendon.  · Aging.  · Repetitive, mild injuries.  SYMPTOMS   · Pain may or may not be present. If it is present, it may occur when moving the joint.  · Tenderness when pressure is applied to the tendon.  · A snapping or popping sound when the joint moves.  · Decreased motion of the joint.  · Difficulty sleeping due to pain in the joint.  DIAGNOSIS   Your health care provider will perform a physical exam. Imaging tests may also be used to make the diagnosis. These may include X-rays, an MRI, or a CT scan.  TREATMENT   Generally, calcific tendinitis resolves on its own. Treatment for pain of calcific tendinitis may include:  · Taking over-the-counter medicines, such as anti-inflammatory drugs.  · Applying ice packs to the joint.  · Following a specific exercise program to keep the joint working properly.  · Attending physical therapy sessions.  · Avoiding activities that cause pain.  Treatment for more severe calcific tendinitis may require:  · Injecting cortisone steroids or pain relieving medicines into or around the joint.  · Manipulating the joint after you are given medicine to numb the area (local anesthetic).  · Inflating the joint with sterile fluid to increase the flexibility of the tendons.  · Shockwave therapy, which involves focusing sound  waves on the joint.  If other treatments do not work, surgery may be done to clean out the calcium deposits and repair the tendons where needed. Most people do not need surgery.  HOME CARE INSTRUCTIONS   · Only take over-the-counter or prescription medicines for pain, fever, or discomfort as directed by your health care provider.  · Follow your health care provider's recommendations for activity and exercise.  SEEK MEDICAL CARE IF:  · You notice an increase in pain or numbness.  · You develop new weakness.  · You notice increased joint stiffness or a sensation of looseness in the joint.  · You notice increasing redness, swelling, or warmth around the joint area.  SEEK IMMEDIATE MEDICAL CARE IF:  · You have a fever or persistent symptoms for more than 2 to 3 days.  · You have a fever and your symptoms suddenly get worse.  MAKE SURE YOU:  · Understand these instructions.  · Will watch your condition.  · Will get help right away if you are not doing well or get worse.  Document Released: 07/08/2008 Document Revised: 02/13/2014 Document Reviewed: 01/08/2012  [employer]® Patient Information ©2015 [employer]. This information is not intended to replace advice given to you by your health care provider. Make sure you discuss any questions you have with your health care provider.

## 2014-08-11 NOTE — ED Notes (Signed)
Patient states he fell around 5:30pm yesterday and caught himself.  Patient c/o right shoulder pain.

## 2014-08-11 NOTE — ED Provider Notes (Signed)
CSN: [id number]     Arrival date & time 08/11/14  0308 History   First MD Initiated Contact with Patient 08/11/14 0329     Chief Complaint  Patient presents with  . Shoulder Injury     (Consider location/radiation/quality/duration/timing/severity/associated sxs/prior Treatment) HPI Patient states at 5:30 PM yesterday he tripped from standing and fell onto an outstretched right hand. He complains of right shoulder pain. The pain has been persistent since since that time. The pain radiates up into the right trapezius and right bicep area. There is no deformity. Pain with range of motion. No numbness or tingling. No weakness. No head or neck injury. Past Medical History  Diagnosis Date  . Hypertension   . High cholesterol   . Sleep apnea   . Kidney stones    Past Surgical History  Procedure Laterality Date  . Back surgery    . Tonsillectomy    . Hernia repair     No family history on file. History  Substance Use Topics  . Smoking status: Former Games developermoker  . Smokeless tobacco: Not on file  . Alcohol Use: No    Review of Systems  Constitutional: Negative for fever and chills.  Respiratory: Negative for cough and shortness of breath.   Gastrointestinal: Negative for nausea, vomiting and abdominal pain.  Musculoskeletal: Positive for arthralgias and myalgias. Negative for neck pain.  Skin: Negative for rash and wound.  Neurological: Negative for dizziness, weakness, light-headedness, numbness and headaches.  All other systems reviewed and are negative.     Allergies  Review of patient's allergies indicates no known allergies.  Home Medications   Prior to Admission medications   Medication Sig Start Date End Date Taking? Authorizing Provider  amphetamine-dextroamphetamine (ADDERALL) 20 MG tablet Take 20 mg by mouth 2 (two) times daily.   Yes Historical Provider, MD  aspirin EC 81 MG tablet Take 81 mg by mouth daily.   Yes Historical Provider, MD  hydrochlorothiazide  (HYDRODIURIL) 25 MG tablet Take 25 mg by mouth daily.     Yes Historical Provider, MD  HYDROcodone-acetaminophen (NORCO) 10-325 MG per tablet Take 1 tablet by mouth every 6 (six) hours as needed for pain. 02/05/13  Yes Gerhard Munchobert Lockwood, MD  lisinopril (PRINIVIL,ZESTRIL) 40 MG tablet Take 40 mg by mouth daily.     Yes Historical Provider, MD  metFORMIN (GLUCOPHAGE) 500 MG tablet Take 500 mg by mouth every evening.   Yes Historical Provider, MD  ondansetron (ZOFRAN ODT) 4 MG disintegrating tablet Take 1 tablet (4 mg total) by mouth every 8 (eight) hours as needed for nausea. 12/02/12   Annamarie Dawleyerry S Strand, MD  pravastatin (PRAVACHOL) 20 MG tablet Take 20 mg by mouth at bedtime.     Historical Provider, MD   BP 134/81  Pulse 60  Temp(Src) 98.3 F (36.8 C) (Oral)  Resp 18  Ht 5\' 9"  (1.753 m)  Wt 260 lb (117.935 kg)  BMI 38.38 kg/m2  SpO2 96% Physical Exam  Nursing note and vitals reviewed. Constitutional: He is oriented to person, place, and time. He appears well-developed and well-nourished. No distress.  HENT:  Head: Normocephalic and atraumatic.  Mouth/Throat: Oropharynx is clear and moist.  Eyes: EOM are normal. Pupils are equal, round, and reactive to light.  Neck: Normal range of motion. Neck supple.  No midline cervical tenderness to palpation.  Cardiovascular: Normal rate and regular rhythm.   Pulmonary/Chest: Effort normal and breath sounds normal. No respiratory distress. He has no wheezes. He has no rales.  Abdominal: Soft. Bowel sounds are normal.  Musculoskeletal: Normal range of motion. He exhibits tenderness. He exhibits no edema.  Pain with range of motion of the right shoulder. He has tenderness to palpation over the right deltoid, right trapezius and right bicep. There is no deformity. Distal pulses are intact. No tenderness along the clavicle.  Neurological: He is alert and oriented to person, place, and time.  5/5 grip strength. Difficult to assess motor of abduction of the  right shoulder due to pain. Sensation is intact.  Skin: Skin is warm and dry. No rash noted. No erythema.  Psychiatric: He has a normal mood and affect. His behavior is normal.    ED Course  Procedures (including critical care time) Labs Review Labs Reviewed - No data to display  Imaging Review Dg Shoulder Right  08/11/2014   CLINICAL DATA:  Larey SeatFell on outstretched hand yesterday at 5:30 p.m., shoulder pain. No additional acute clinical information provided.  EXAM: RIGHT SHOULDER - 2+ VIEW  COMPARISON:  None.  FINDINGS: Humeral head is well formed and located. No dislocation. Crescentic calcification at the greater tuberosity. Mild acromioclavicular osteoarthrosis. No destructive bony lesions. Soft tissue planes are nonsuspicious.  IMPRESSION: No acute fracture deformity or dislocation.  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   Electronically Signed   By: Awilda Metroourtnay  Bloomer   On: 08/11/2014 05:48     EKG Interpretation None      MDM   Final diagnoses:  Right shoulder pain   Patient with evidence of calcific tendinosis. We'll treat with anti-inflammatories. Given sling for comfort. Return precautions given.    Loren Raceravid Baelynn Schmuhl, MD 08/11/14 [phone]

## 2016-01-25 ENCOUNTER — Emergency Department (HOSPITAL_COMMUNITY)
Admission: EM | Admit: 2016-01-25 | Discharge: 2016-01-25 | Disposition: A | Discharge status: Home / Self Care | Payer: Medicare PPO | Attending: Emergency Medicine | Admitting: Emergency Medicine

## 2016-01-25 ENCOUNTER — Encounter (HOSPITAL_COMMUNITY): Payer: Self-pay | Admitting: Nurse Practitioner

## 2016-01-25 DIAGNOSIS — Y92410 Unspecified street and highway as the place of occurrence of the external cause: Secondary | ICD-10-CM | POA: Diagnosis not present

## 2016-01-25 DIAGNOSIS — S134XXA Sprain of ligaments of cervical spine, initial encounter: Secondary | ICD-10-CM | POA: Insufficient documentation

## 2016-01-25 DIAGNOSIS — K259 Gastric ulcer, unspecified as acute or chronic, without hemorrhage or perforation: Secondary | ICD-10-CM | POA: Diagnosis not present

## 2016-01-25 DIAGNOSIS — S3992XA Unspecified injury of lower back, initial encounter: Secondary | ICD-10-CM | POA: Insufficient documentation

## 2016-01-25 DIAGNOSIS — Z87891 Personal history of nicotine dependence: Secondary | ICD-10-CM | POA: Insufficient documentation

## 2016-01-25 DIAGNOSIS — Z7984 Long term (current) use of oral hypoglycemic drugs: Secondary | ICD-10-CM | POA: Diagnosis not present

## 2016-01-25 DIAGNOSIS — Y998 Other external cause status: Secondary | ICD-10-CM | POA: Insufficient documentation

## 2016-01-25 DIAGNOSIS — S4992XA Unspecified injury of left shoulder and upper arm, initial encounter: Secondary | ICD-10-CM | POA: Diagnosis not present

## 2016-01-25 DIAGNOSIS — I10 Essential (primary) hypertension: Secondary | ICD-10-CM | POA: Insufficient documentation

## 2016-01-25 DIAGNOSIS — Z79899 Other long term (current) drug therapy: Secondary | ICD-10-CM | POA: Insufficient documentation

## 2016-01-25 DIAGNOSIS — Y9389 Activity, other specified: Secondary | ICD-10-CM | POA: Insufficient documentation

## 2016-01-25 DIAGNOSIS — E7800 Pure hypercholesterolemia, unspecified: Secondary | ICD-10-CM | POA: Insufficient documentation

## 2016-01-25 DIAGNOSIS — Z87442 Personal history of urinary calculi: Secondary | ICD-10-CM | POA: Diagnosis not present

## 2016-01-25 DIAGNOSIS — G8929 Other chronic pain: Secondary | ICD-10-CM | POA: Diagnosis not present

## 2016-01-25 DIAGNOSIS — S199XXA Unspecified injury of neck, initial encounter: Secondary | ICD-10-CM | POA: Diagnosis present

## 2016-01-25 HISTORY — DX: Other chronic pain: G89.29

## 2016-01-25 HISTORY — DX: Gastric ulcer, unspecified as acute or chronic, without hemorrhage or perforation: K25.9

## 2016-01-25 HISTORY — DX: Dorsalgia, unspecified: M54.9

## 2016-01-25 MED ORDER — IBUPROFEN 600 MG PO TABS: 600.0000 mg | ORAL_TABLET | Freq: Four times a day (QID) | ORAL | Status: DC | PRN

## 2016-01-25 MED ORDER — ACETAMINOPHEN 500 MG PO TABS
1000.0000 mg | ORAL_TABLET | Freq: Once | ORAL | Status: AC
  Administered 2016-01-25: 1000 mg via ORAL
  Filled 2016-01-25: qty 2

## 2016-01-25 MED ORDER — KETOROLAC TROMETHAMINE 30 MG/ML IJ SOLN
30.0000 mg | Freq: Once | INTRAMUSCULAR | Status: AC
  Administered 2016-01-25: 30 mg via INTRAVENOUS
  Filled 2016-01-25: qty 1

## 2016-01-25 MED ORDER — KETOROLAC TROMETHAMINE 60 MG/2ML IM SOLN: 60.0000 mg | Freq: Once | INTRAMUSCULAR | Status: DC
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
## 2016-01-25 NOTE — Discharge Instructions (Signed)
Cervical Strain and Sprain With Rehab  Cervical strain and sprain are injuries that commonly occur with "whiplash" injuries. Whiplash occurs when the neck is forcefully whipped backward or forward, such as during a motor vehicle accident or during contact sports. The muscles, ligaments, tendons, discs, and nerves of the neck are susceptible to injury when this occurs.  RISK FACTORS  Risk of having a whiplash injury increases if:  · Osteoarthritis of the spine.  · Situations that make head or neck accidents or trauma more likely.  · High-risk sports (football, rugby, wrestling, hockey, auto racing, gymnastics, diving, contact karate, or boxing).  · Poor strength and flexibility of the neck.  · Previous neck injury.  · Poor tackling technique.  · Improperly fitted or padded equipment.  SYMPTOMS   · Pain or stiffness in the front or back of neck or both.  · Symptoms may present immediately or up to 24 hours after injury.  · Dizziness, headache, nausea, and vomiting.  · Muscle spasm with soreness and stiffness in the neck.  · Tenderness and swelling at the injury site.  PREVENTION  · Learn and use proper technique (avoid tackling with the head, spearing, and head-butting; use proper falling techniques to avoid landing on the head).  · Warm up and stretch properly before activity.  · Maintain physical fitness:    Strength, flexibility, and endurance.    Cardiovascular fitness.  · Wear properly fitted and padded protective equipment, such as padded soft collars, for participation in contact sports.  PROGNOSIS   Recovery from cervical strain and sprain injuries is dependent on the extent of the injury. These injuries are usually curable in 1 week to 3 months with appropriate treatment.   RELATED COMPLICATIONS   · Temporary numbness and weakness may occur if the nerve roots are damaged, and this may persist until the nerve has completely healed.  · Chronic pain due to frequent recurrence of symptoms.  · Prolonged healing,  especially if activity is resumed too soon (before complete recovery).  TREATMENT   Treatment initially involves the use of ice and medication to help reduce pain and inflammation. It is also important to perform strengthening and stretching exercises and modify activities that worsen symptoms so the injury does not get worse. These exercises may be performed at home or with a therapist. For patients who experience severe symptoms, a soft, padded collar may be recommended to be worn around the neck.   Improving your posture may help reduce symptoms. Posture improvement includes pulling your chin and abdomen in while sitting or standing. If you are sitting, sit in a firm chair with your buttocks against the back of the chair. While sleeping, try replacing your pillow with a small towel rolled to 2 inches in diameter, or use a cervical pillow or soft cervical collar. Poor sleeping positions delay healing.   For patients with nerve root damage, which causes numbness or weakness, the use of a cervical traction apparatus may be recommended. Surgery is rarely necessary for these injuries. However, cervical strain and sprains that are present at birth (congenital) may require surgery.  MEDICATION   · If pain medication is necessary, nonsteroidal anti-inflammatory medications, such as aspirin and ibuprofen, or other minor pain relievers, such as acetaminophen, are often recommended.  · Do not take pain medication for 7 days before surgery.  · Prescription pain relievers may be given if deemed necessary by your caregiver. Use only as directed and only as much as you need.    HEAT AND COLD:   · Cold treatment (icing) relieves pain and reduces inflammation. Cold treatment should be applied for 10 to 15 minutes every 2 to 3 hours for inflammation and pain and immediately after any activity that aggravates your symptoms. Use ice packs or an ice massage.  · Heat treatment may be used prior to performing the stretching and  strengthening activities prescribed by your caregiver, physical therapist, or athletic trainer. Use a heat pack or a warm soak.  SEEK MEDICAL CARE IF:   · Symptoms get worse or do not improve in 2 weeks despite treatment.  · New, unexplained symptoms develop (drugs used in treatment may produce side effects).  EXERCISES  RANGE OF MOTION (ROM) AND STRETCHING EXERCISES - Cervical Strain and Sprain  These exercises may help you when beginning to rehabilitate your injury. In order to successfully resolve your symptoms, you must improve your posture. These exercises are designed to help reduce the forward-head and rounded-shoulder posture which contributes to this condition. Your symptoms may resolve with or without further involvement from your physician, physical therapist or athletic trainer. While completing these exercises, remember:   · Restoring tissue flexibility helps normal motion to return to the joints. This allows healthier, less painful movement and activity.  · An effective stretch should be held for at least 20 seconds, although you may need to begin with shorter hold times for comfort.  · A stretch should never be painful. You should only feel a gentle lengthening or release in the stretched tissue.  STRETCH- Axial Extensors  · Lie on your back on the floor. You may bend your knees for comfort. Place a rolled-up hand towel or dish towel, about 2 inches in diameter, under the part of your head that makes contact with the floor.  · Gently tuck your chin, as if trying to make a "double chin," until you feel a gentle stretch at the base of your head.  · Hold __________ seconds.  Repeat __________ times. Complete this exercise __________ times per day.   STRETCH - Axial Extension   · Stand or sit on a firm surface. Assume a good posture: chest up, shoulders drawn back, abdominal muscles slightly tense, knees unlocked (if standing) and feet hip width apart.  · Slowly retract your chin so your head slides back  and your chin slightly lowers. Continue to look straight ahead.  · You should feel a gentle stretch in the back of your head. Be certain not to feel an aggressive stretch since this can cause headaches later.  · Hold for __________ seconds.  Repeat __________ times. Complete this exercise __________ times per day.  STRETCH - Cervical Side Bend   · Stand or sit on a firm surface. Assume a good posture: chest up, shoulders drawn back, abdominal muscles slightly tense, knees unlocked (if standing) and feet hip width apart.  · Without letting your nose or shoulders move, slowly tip your right / left ear to your shoulder until your feel a gentle stretch in the muscles on the opposite side of your neck.  · Hold __________ seconds.  Repeat __________ times. Complete this exercise __________ times per day.  STRETCH - Cervical Rotators   · Stand or sit on a firm surface. Assume a good posture: chest up, shoulders drawn back, abdominal muscles slightly tense, knees unlocked (if standing) and feet hip width apart.  · Keeping your eyes level with the ground, slowly turn your head until you feel a gentle stretch along   the back and opposite side of your neck.  · Hold __________ seconds.  Repeat __________ times. Complete this exercise __________ times per day.  RANGE OF MOTION - Neck Circles   · Stand or sit on a firm surface. Assume a good posture: chest up, shoulders drawn back, abdominal muscles slightly tense, knees unlocked (if standing) and feet hip width apart.  · Gently roll your head down and around from the back of one shoulder to the back of the other. The motion should never be forced or painful.  · Repeat the motion 10-20 times, or until you feel the neck muscles relax and loosen.  Repeat __________ times. Complete the exercise __________ times per day.  STRENGTHENING EXERCISES - Cervical Strain and Sprain  These exercises may help you when beginning to rehabilitate your injury. They may resolve your symptoms with or  without further involvement from your physician, physical therapist, or athletic trainer. While completing these exercises, remember:   · Muscles can gain both the endurance and the strength needed for everyday activities through controlled exercises.  · Complete these exercises as instructed by your physician, physical therapist, or athletic trainer. Progress the resistance and repetitions only as guided.  · You may experience muscle soreness or fatigue, but the pain or discomfort you are trying to eliminate should never worsen during these exercises. If this pain does worsen, stop and make certain you are following the directions exactly. If the pain is still present after adjustments, discontinue the exercise until you can discuss the trouble with your clinician.  STRENGTH - Cervical Flexors, Isometric  · Face a wall, standing about 6 inches away. Place a small pillow, a ball about 6-8 inches in diameter, or a folded towel between your forehead and the wall.  · Slightly tuck your chin and gently push your forehead into the soft object. Push only with mild to moderate intensity, building up tension gradually. Keep your jaw and forehead relaxed.  · Hold 10 to 20 seconds. Keep your breathing relaxed.  · Release the tension slowly. Relax your neck muscles completely before you start the next repetition.  Repeat __________ times. Complete this exercise __________ times per day.  STRENGTH- Cervical Lateral Flexors, Isometric   · Stand about 6 inches away from a wall. Place a small pillow, a ball about 6-8 inches in diameter, or a folded towel between the side of your head and the wall.  · Slightly tuck your chin and gently tilt your head into the soft object. Push only with mild to moderate intensity, building up tension gradually. Keep your jaw and forehead relaxed.  · Hold 10 to 20 seconds. Keep your breathing relaxed.  · Release the tension slowly. Relax your neck muscles completely before you start the next  repetition.  Repeat __________ times. Complete this exercise __________ times per day.  STRENGTH - Cervical Extensors, Isometric   · Stand about 6 inches away from a wall. Place a small pillow, a ball about 6-8 inches in diameter, or a folded towel between the back of your head and the wall.  · Slightly tuck your chin and gently tilt your head back into the soft object. Push only with mild to moderate intensity, building up tension gradually. Keep your jaw and forehead relaxed.  · Hold 10 to 20 seconds. Keep your breathing relaxed.  · Release the tension slowly. Relax your neck muscles completely before you start the next repetition.  Repeat __________ times. Complete this exercise __________ times per day.    POSTURE AND BODY MECHANICS CONSIDERATIONS - Cervical Strain and Sprain  Keeping correct posture when sitting, standing or completing your activities will reduce the stress put on different body tissues, allowing injured tissues a chance to heal and limiting painful experiences. The following are general guidelines for improved posture. Your physician or physical therapist will provide you with any instructions specific to your needs. While reading these guidelines, remember:  · The exercises prescribed by your provider will help you have the flexibility and strength to maintain correct postures.  · The correct posture provides the optimal environment for your joints to work. All of your joints have less wear and tear when properly supported by a spine with good posture. This means you will experience a healthier, less painful body.  · Correct posture must be practiced with all of your activities, especially prolonged sitting and standing. Correct posture is as important when doing repetitive low-stress activities (typing) as it is when doing a single heavy-load activity (lifting).  PROLONGED STANDING WHILE SLIGHTLY LEANING FORWARD  When completing a task that requires you to lean forward while standing in one  place for a long time, place either foot up on a stationary 2- to 4-inch high object to help maintain the best posture. When both feet are on the ground, the low back tends to lose its slight inward curve. If this curve flattens (or becomes too large), then the back and your other joints will experience too much stress, fatigue more quickly, and can cause pain.   RESTING POSITIONS  Consider which positions are most painful for you when choosing a resting position. If you have pain with flexion-based activities (sitting, bending, stooping, squatting), choose a position that allows you to rest in a less flexed posture. You would want to avoid curling into a fetal position on your side. If your pain worsens with extension-based activities (prolonged standing, working overhead), avoid resting in an extended position such as sleeping on your stomach. Most people will find more comfort when they rest with their spine in a more neutral position, neither too rounded nor too arched. Lying on a non-sagging bed on your side with a pillow between your knees, or on your back with a pillow under your knees will often provide some relief. Keep in mind, being in any one position for a prolonged period of time, no matter how correct your posture, can still lead to stiffness.  WALKING  Walk with an upright posture. Your ears, shoulders, and hips should all line up.  OFFICE WORK  When working at a desk, create an environment that supports good, upright posture. Without extra support, muscles fatigue and lead to excessive strain on joints and other tissues.  CHAIR:  · A chair should be able to slide under your desk when your back makes contact with the back of the chair. This allows you to work closely.  · The chair's height should allow your eyes to be level with the upper part of your monitor and your hands to be slightly lower than your elbows.  · Body position:    Your feet should make contact with the floor. If this is not  possible, use a foot rest.    Keep your ears over your shoulders. This will reduce stress on your neck and low back.     This information is not intended to replace advice given to you by your health care provider. Make sure you discuss any questions you have with your health care provider.       Document Released: 09/29/2005 Document Revised: 10/20/2014 Document Reviewed: 01/11/2009  [employer] Interactive Patient Education ©2016 [employer]
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
## 2016-04-08 ENCOUNTER — Other Ambulatory Visit: Payer: Self-pay | Admitting: Orthopedic Surgery

## 2016-04-09 ENCOUNTER — Other Ambulatory Visit (HOSPITAL_COMMUNITY): Payer: Self-pay | Admitting: *Deleted

## 2016-04-09 NOTE — Pre-Procedure Instructions (Signed)
Ron AgeeJeffrey H Labrie  04/09/2016      [facility] - [place], [place] - [id number] [place] [id number] Phone: [phone] Fax: [phone]    Your procedure is scheduled on 04-17-2016  Thursday  .  [facility] at 8:45 A.M.   Call this number if you have problems the morning of surgery:  [phone]   Remember:  Do not eat food or drink liquids after midnight.   Take these medicines the morning of surgery with A SIP OF WATER Alprazolam(Xanax),Adderall,atorvastin(Lipitor),citalopram(Celexa),famotidine(Pepcid),gabapentin(Neurontin),pain medication if needed,tizanidine(Zanaflex)  WHAT DO I DO ABOUT MY DIABETES MEDICATION?  Marland Kitchen. Do not take oral diabetes medicines (pills) the morning of surgery  Do NOT take Metformin the morning of surgery.               STOP ASPIRIN,ANTIINFLAMATORIES (IBUPROFEN,ALEVE,MOTRIN,ADVIL,GOODY'S POWDERS),HERBAL SUPPLEMENTS,FISH OIL,AND VITAMINS 5-7 DAYS PRIOR TO SURGERY            How to Manage Your Diabetes Before and After Surgery  Why is it important to control my blood sugar before and after surgery? . Improving blood sugar levels before and after surgery helps healing and can limit problems. . A way of improving blood sugar control is eating a healthy diet by: o  Eating less sugar and carbohydrates o  Increasing activity/exercise o  Talking with your doctor about reaching your blood sugar goals . High blood sugars (greater than 180 mg/dL) can raise your risk of infections and slow your recovery, so you will need to focus on controlling your diabetes during the weeks before surgery. . Make sure that the doctor who takes care of your diabetes knows about your planned surgery including the date and location.  How do I manage my blood sugar before surgery? . Check your blood sugar at least 4 times a day, starting 2 days before surgery, to make sure that the level is  not too high or low. o Check your blood sugar the morning of your surgery when you wake up and every 2 hours until you get to the Short Stay unit. . If your blood sugar is less than 70 mg/dL, you will need to treat for low blood sugar: o Do not take insulin. o Treat a low blood sugar (less than 70 mg/dL) with  cup of clear juice (cranberry or apple), 4 glucose tablets, OR glucose gel. o Recheck blood sugar in 15 minutes after treatment (to make sure it is greater than 70 mg/dL). If your blood sugar is not greater than 70 mg/dL on recheck, call [phone] for further instructions. . Report your blood sugar to the short stay nurse when you get to Short Stay.  . If you are admitted to the hospital after surgery: o Your blood sugar will be checked by the staff and you will probably be given insulin after surgery (instead of oral diabetes medicines) to make sure you have good blood sugar levels. o The goal for blood sugar control after surgery is 80-180 mg/dL.    Do not wear jewelry,  Do not wear lotions, powders, or perfumes.  You may not wear deoderant.  Do not shave 48 hours prior to surgery.  Men may shave face and neck.   Do not bring valuables to the hospital.  [facility] is not responsible for any belongings or valuables.  Contacts, dentures or bridgework may not be worn into surgery.  Leave your suitcase in  the car.  After surgery it may be brought to your room.  For patients admitted to the hospital, discharge time will be determined by your treatment team.  Patients discharged the day of surgery will not be allowed to drive home.    Special instructions:  See attached Sheet for instructions on CHG showers  Please read over the following fact sheets that you were given. MRSA Information and Surgical Site Infection Prevention,Incentive Spirometry

## 2016-04-10 ENCOUNTER — Ambulatory Visit (HOSPITAL_COMMUNITY)
Admission: RE | Admit: 2016-04-10 | Discharge: 2016-04-10 | Disposition: A | Discharge status: Home / Self Care | Payer: Medicare PPO | Source: Ambulatory Visit | Attending: Orthopedic Surgery | Admitting: Orthopedic Surgery

## 2016-04-10 ENCOUNTER — Encounter (HOSPITAL_COMMUNITY): Payer: Self-pay

## 2016-04-10 ENCOUNTER — Encounter (HOSPITAL_COMMUNITY)
Admission: RE | Admit: 2016-04-10 | Discharge: 2016-04-10 | Disposition: A | Discharge status: Home / Self Care | Payer: Medicare PPO | Source: Ambulatory Visit | Attending: Orthopedic Surgery | Admitting: Orthopedic Surgery

## 2016-04-10 DIAGNOSIS — Z01818 Encounter for other preprocedural examination: Secondary | ICD-10-CM

## 2016-04-10 DIAGNOSIS — Z01812 Encounter for preprocedural laboratory examination: Secondary | ICD-10-CM | POA: Diagnosis not present

## 2016-04-10 DIAGNOSIS — I517 Cardiomegaly: Secondary | ICD-10-CM | POA: Diagnosis not present

## 2016-04-10 HISTORY — DX: Other specified behavioral and emotional disorders with onset usually occurring in childhood and adolescence: F98.8

## 2016-04-10 HISTORY — DX: Gastro-esophageal reflux disease without esophagitis: K21.9

## 2016-04-10 HISTORY — DX: Unspecified osteoarthritis, unspecified site: M19.90

## 2016-04-10 HISTORY — DX: Anxiety disorder, unspecified: F41.9

## 2016-04-10 HISTORY — DX: Type 2 diabetes mellitus without complications: E11.9

## 2016-04-10 HISTORY — DX: Cardiac murmur, unspecified: R01.1

## 2016-04-10 HISTORY — DX: Personal history of pneumonia (recurrent): Z87.01

## 2016-04-10 HISTORY — DX: Depression, unspecified: F32.A

## 2016-04-10 HISTORY — DX: Major depressive disorder, single episode, unspecified: F32.9

## 2016-04-10 HISTORY — DX: Personal history of other diseases of the respiratory system: Z87.09

## 2016-04-10 LAB — URINALYSIS, ROUTINE W REFLEX MICROSCOPIC
BILIRUBIN URINE: NEGATIVE
Glucose, UA: NEGATIVE mg/dL
Hgb urine dipstick: NEGATIVE
Ketones, ur: NEGATIVE mg/dL
Leukocytes, UA: NEGATIVE
NITRITE: NEGATIVE
Protein, ur: NEGATIVE mg/dL
SPECIFIC GRAVITY, URINE: 1.022 (ref 1.005–1.030)
pH: 6 (ref 5.0–8.0)

## 2016-04-10 LAB — CBC WITH DIFFERENTIAL/PLATELET
BASOS ABS: 0.1 10*3/uL (ref 0.0–0.1)
BASOS PCT: 1 %
Eosinophils Absolute: 0.5 10*3/uL (ref 0.0–0.7)
Eosinophils Relative: 4 %
HEMATOCRIT: 40.8 % (ref 39.0–52.0)
HEMOGLOBIN: 14 g/dL (ref 13.0–17.0)
Lymphocytes Relative: 27 %
Lymphs Abs: 2.8 10*3/uL (ref 0.7–4.0)
MCH: 29.6 pg (ref 26.0–34.0)
MCHC: 34.3 g/dL (ref 30.0–36.0)
MCV: 86.3 fL (ref 78.0–100.0)
Monocytes Absolute: 0.9 10*3/uL (ref 0.1–1.0)
Monocytes Relative: 9 %
NEUTROS ABS: 6.4 10*3/uL (ref 1.7–7.7)
NEUTROS PCT: 59 %
Platelets: 223 10*3/uL (ref 150–400)
RBC: 4.73 MIL/uL (ref 4.22–5.81)
RDW: 15.2 % (ref 11.5–15.5)
WBC: 10.6 10*3/uL — ABNORMAL HIGH (ref 4.0–10.5)

## 2016-04-10 LAB — COMPREHENSIVE METABOLIC PANEL
ALBUMIN: 4.3 g/dL (ref 3.5–5.0)
ALK PHOS: 60 U/L (ref 38–126)
ALT: 23 U/L (ref 17–63)
AST: 19 U/L (ref 15–41)
Anion gap: 8 (ref 5–15)
BILIRUBIN TOTAL: 0.5 mg/dL (ref 0.3–1.2)
BUN: 17 mg/dL (ref 6–20)
CALCIUM: 9.7 mg/dL (ref 8.9–10.3)
CO2: 24 mmol/L (ref 22–32)
Chloride: 105 mmol/L (ref 101–111)
Creatinine, Ser: 0.85 mg/dL (ref 0.61–1.24)
GFR calc Af Amer: >60 mL/min (ref >60)
GFR calc non Af Amer: >60 mL/min (ref >60)
GLUCOSE: 113 mg/dL — AB (ref 65–99)
Potassium: 3.5 mmol/L (ref 3.5–5.1)
Sodium: 137 mmol/L (ref 135–145)
TOTAL PROTEIN: 7 g/dL (ref 6.5–8.1)

## 2016-04-10 LAB — ABO/RH: ABO/RH(D): A POS

## 2016-04-10 LAB — PROTIME-INR
INR: 0.99 (ref 0.00–1.49)
Prothrombin Time: 13.3 seconds (ref 11.6–15.2)

## 2016-04-10 LAB — GLUCOSE, CAPILLARY: GLUCOSE-CAPILLARY: 136 mg/dL — AB (ref 65–99)

## 2016-04-10 LAB — TYPE AND SCREEN
ABO/RH(D): A POS
ANTIBODY SCREEN: NEGATIVE

## 2016-04-10 LAB — SURGICAL PCR SCREEN
MRSA, PCR: NEGATIVE
STAPHYLOCOCCUS AUREUS: POSITIVE — AB

## 2016-04-10 LAB — APTT: APTT: 28 s (ref 24–37)

## 2016-04-10 NOTE — Progress Notes (Signed)
Patient notified of positive PCR and verbalized understanding.  Prescription called to [facility] in [place], [place].

## 2016-04-10 NOTE — Progress Notes (Signed)
PCP - Dr. Laurence [place]le [place] Cardiologist - denies  EKG - requested from Dr. Orvan Falconerampbell CXR - 04/10/16 Echo- denies Stress test/Cardiac Cath - requested - pt. States that he believes he had these done about 5 years ago  Patient denies chest pain and shortness of breath at PAT appointment.   Patient states that he is pre-diabetic and only checks his blood sugars at home once a month.

## 2016-04-11 LAB — HEMOGLOBIN A1C
Hgb A1c MFr Bld: 5.9 % — ABNORMAL HIGH (ref 4.8–5.6)
Mean Plasma Glucose: 123 mg/dL

## 2016-04-14 NOTE — H&P (Signed)
PREOPERATIVE H&P  Chief Complaint: left arm pain  HPI: Joseph Shepard is a 50 y.o. male who presents with ongoing pain in the left arm, as well as weakness involving the left arm and hand.  MRI reveals very degrees of neuroforaminal stenosis spanning C3-C4 to C6-C7  Patient has failed multiple forms of conservative care and continues to have pain (see office notes for additional details regarding the patient's full course of treatment)  Past Medical History  Diagnosis Date  . Hypertension   . High cholesterol   . Kidney stones   . Chronic back pain   . Multiple gastric ulcers   . Sleep apnea     CPAP  . Depression   . ADD (attention deficit disorder)   . GERD (gastroesophageal reflux disease)   . Heart murmur     "was told in early 20's that I had a heart murmur"  . History of pneumonia   . History of bronchitis as a child   . Diabetes mellitus without complication (HCC)     pre-diabetic; takes Metformin  . Anxiety   . Arthritis    Past Surgical History  Procedure Laterality Date  . Tonsillectomy    . Hernia repair    . Back surgery      x3  . Cardiac catheterization    . Esophagogastroduodenoscopy     Social History   Social History  . Marital Status: Married    Spouse Name: N/A  . Number of Children: N/A  . Years of Education: N/A   Social History Main Topics  . Smoking status: Current Every Day Smoker -- 0.50 packs/day    Types: Cigarettes  . Smokeless tobacco: Not on file  . Alcohol Use: No  . Drug Use: Yes    Special: Marijuana     Comment: "not smoked in over 5 years"  04/10/16  . Sexual Activity: Yes   Other Topics Concern  . Not on file   Social History Narrative   No family history on file. No Known Allergies Prior to Admission medications   Medication Sig Start Date End Date Taking? Authorizing Provider  ALPRAZolam Prudy Feeler(XANAX) 1 MG tablet Take 1 mg by mouth 2 (two) times daily.    Yes Historical Provider, MD    amphetamine-dextroamphetamine (ADDERALL) 20 MG tablet Take 20 mg by mouth 2 (two) times daily.   Yes Historical Provider, MD  atorvastatin (LIPITOR) 20 MG tablet Take 20 mg by mouth daily.   Yes Historical Provider, MD  citalopram (CELEXA) 40 MG tablet Take 40 mg by mouth daily.   Yes Historical Provider, MD  diclofenac sodium (VOLTAREN) 1 % GEL Apply 2 g topically 4 (four) times daily.   Yes Historical Provider, MD  famotidine (PEPCID) 40 MG tablet Take 40 mg by mouth daily.   Yes Historical Provider, MD  gabapentin (NEURONTIN) 300 MG capsule Take 300 mg by mouth 3 (three) times daily.   Yes Historical Provider, MD  hydrochlorothiazide (HYDRODIURIL) 25 MG tablet Take 25 mg by mouth daily.     Yes Historical Provider, MD  HYDROcodone-acetaminophen (NORCO) 10-325 MG per tablet Take 1 tablet by mouth every 6 (six) hours as needed for pain. 02/05/13  Yes Gerhard Munchobert Lockwood, MD  lisinopril (PRINIVIL,ZESTRIL) 40 MG tablet Take 40 mg by mouth daily.     Yes Historical Provider, MD  metFORMIN (GLUCOPHAGE) 500 MG tablet Take 500 mg by mouth every evening.   Yes Historical Provider, MD  tiZANidine (ZANAFLEX) 4 MG  tablet Take 4 mg by mouth 3 (three) times daily.   Yes Historical Provider, MD  Vitamin D, Ergocalciferol, (DRISDOL) 50000 units CAPS capsule Take 50,000 Units by mouth every Wednesday.    Yes Historical Provider, MD     All other systems have been reviewed and were otherwise negative with the exception of those mentioned in the HPI and as above.  Physical Exam: There were no vitals filed for this visit.  General: Alert, no acute distress Cardiovascular: No pedal edema Respiratory: No cyanosis, no use of accessory musculature Skin: No lesions in the area of chief complaint Neurologic: Sensation intact distally Psychiatric: Patient is competent for consent with normal mood and affect Lymphatic: No axillary or cervical lymphadenopathy  MUSCULOSKELETAL: patient is noted to have weakness to his  left triceps musculature  Assessment/Plan: Left arm pain  Plan for Procedure(s): ANTERIOR CERVICAL DECOMPRESSION FUSION CERVICAL 3-4, CERVICAL 4-5, CERVICAL 5-6, CERVICAL 6-7 WITH INSTRUMENTATION AND ALLOGRAFT   Emilee HeroUMONSKI,Kendrea Cerritos LEONARD, MD 04/14/2016 2:56 PM

## 2016-04-14 NOTE — Progress Notes (Signed)
Patient called to inform nurse that the cardiologist that did the stress test and cardiac cath was Dr. Danella MaiersJack Painter.  Stress test results have been received.  Will requested cardiac cath from Dr. Danella MaiersJack Painter.

## 2016-04-16 MED ORDER — CEFAZOLIN SODIUM-DEXTROSE 2-4 GM/100ML-% IV SOLN
2.0000 g | INTRAVENOUS | Status: AC
  Administered 2016-04-17: 2 g via INTRAVENOUS
  Filled 2016-04-16: qty 100

## 2016-04-17 ENCOUNTER — Inpatient Hospital Stay (HOSPITAL_COMMUNITY)
Admission: RE | Admit: 2016-04-17 | Discharge: 2016-04-18 | DRG: 473 | Disposition: A | Discharge status: Home / Self Care | Payer: Medicare PPO | Source: Ambulatory Visit | Attending: Orthopedic Surgery | Admitting: Orthopedic Surgery

## 2016-04-17 ENCOUNTER — Inpatient Hospital Stay (HOSPITAL_COMMUNITY): Payer: Medicare PPO | Admitting: Certified Registered Nurse Anesthetist

## 2016-04-17 ENCOUNTER — Inpatient Hospital Stay (HOSPITAL_COMMUNITY): Payer: Medicare PPO

## 2016-04-17 ENCOUNTER — Encounter (HOSPITAL_COMMUNITY): Payer: Self-pay | Admitting: Certified Registered Nurse Anesthetist

## 2016-04-17 ENCOUNTER — Encounter (HOSPITAL_COMMUNITY)
Admission: RE | Disposition: A | Discharge status: Home / Self Care | Payer: Self-pay | Source: Ambulatory Visit | Attending: Orthopedic Surgery

## 2016-04-17 DIAGNOSIS — E7800 Pure hypercholesterolemia, unspecified: Secondary | ICD-10-CM | POA: Diagnosis present

## 2016-04-17 DIAGNOSIS — I10 Essential (primary) hypertension: Secondary | ICD-10-CM | POA: Diagnosis present

## 2016-04-17 DIAGNOSIS — Z79899 Other long term (current) drug therapy: Secondary | ICD-10-CM | POA: Diagnosis not present

## 2016-04-17 DIAGNOSIS — K219 Gastro-esophageal reflux disease without esophagitis: Secondary | ICD-10-CM | POA: Diagnosis present

## 2016-04-17 DIAGNOSIS — M4802 Spinal stenosis, cervical region: Secondary | ICD-10-CM | POA: Diagnosis present

## 2016-04-17 DIAGNOSIS — F17210 Nicotine dependence, cigarettes, uncomplicated: Secondary | ICD-10-CM | POA: Diagnosis present

## 2016-04-17 DIAGNOSIS — M79602 Pain in left arm: Secondary | ICD-10-CM | POA: Diagnosis present

## 2016-04-17 DIAGNOSIS — E119 Type 2 diabetes mellitus without complications: Secondary | ICD-10-CM | POA: Diagnosis present

## 2016-04-17 DIAGNOSIS — M5410 Radiculopathy, site unspecified: Secondary | ICD-10-CM | POA: Diagnosis present

## 2016-04-17 DIAGNOSIS — G4730 Sleep apnea, unspecified: Secondary | ICD-10-CM | POA: Diagnosis present

## 2016-04-17 DIAGNOSIS — Z419 Encounter for procedure for purposes other than remedying health state, unspecified: Secondary | ICD-10-CM

## 2016-04-17 DIAGNOSIS — M5010 Cervical disc disorder with radiculopathy, unspecified cervical region: Secondary | ICD-10-CM | POA: Diagnosis present

## 2016-04-17 DIAGNOSIS — Z7984 Long term (current) use of oral hypoglycemic drugs: Secondary | ICD-10-CM

## 2016-04-17 HISTORY — PX: ANTERIOR CERVICAL DECOMPRESSION/DISCECTOMY FUSION 4 LEVELS: SHX5556

## 2016-04-17 LAB — GLUCOSE, CAPILLARY: Glucose-Capillary: 102 mg/dL — ABNORMAL HIGH (ref 65–99)

## 2016-04-17 SURGERY — ANTERIOR CERVICAL DECOMPRESSION/DISCECTOMY FUSION 4 LEVELS
Anesthesia: General | Site: Neck

## 2016-04-17 MED ORDER — PROPOFOL 10 MG/ML IV BOLUS
INTRAVENOUS | Status: DC | PRN
  Administered 2016-04-17: 200 mg via INTRAVENOUS

## 2016-04-17 MED ORDER — SUCCINYLCHOLINE CHLORIDE 200 MG/10ML IV SOSY
PREFILLED_SYRINGE | INTRAVENOUS | Status: DC | PRN
  Administered 2016-04-17: 120 mg via INTRAVENOUS

## 2016-04-17 MED ORDER — EPHEDRINE SULFATE-NACL 50-0.9 MG/10ML-% IV SOSY
PREFILLED_SYRINGE | INTRAVENOUS | Status: DC | PRN
  Administered 2016-04-17: 5 mg via INTRAVENOUS
  Administered 2016-04-17 (×2): 10 mg via INTRAVENOUS
  Administered 2016-04-17: 5 mg via INTRAVENOUS

## 2016-04-17 MED ORDER — BUPIVACAINE-EPINEPHRINE 0.25% -1:200000 IJ SOLN
INTRAMUSCULAR | Status: DC | PRN
  Administered 2016-04-17: 4 mL

## 2016-04-17 MED ORDER — ZOLPIDEM TARTRATE 5 MG PO TABS: 5.0000 mg | ORAL_TABLET | Freq: Every evening | ORAL | Status: DC | PRN

## 2016-04-17 MED ORDER — ROCURONIUM BROMIDE 100 MG/10ML IV SOLN
INTRAVENOUS | Status: DC | PRN
  Administered 2016-04-17: 50 mg via INTRAVENOUS

## 2016-04-17 MED ORDER — ALBUMIN HUMAN 5 % IV SOLN
INTRAVENOUS | Status: DC | PRN
  Administered 2016-04-17 (×3): via INTRAVENOUS

## 2016-04-17 MED ORDER — ONDANSETRON HCL 4 MG/2ML IJ SOLN
INTRAMUSCULAR | Status: DC | PRN
  Administered 2016-04-17: 4 mg via INTRAVENOUS

## 2016-04-17 MED ORDER — FAMOTIDINE 20 MG PO TABS
40.0000 mg | ORAL_TABLET | Freq: Every day | ORAL | Status: DC
  Filled 2016-04-17: qty 2

## 2016-04-17 MED ORDER — LIDOCAINE 2% (20 MG/ML) 5 ML SYRINGE
INTRAMUSCULAR | Status: DC | PRN
  Administered 2016-04-17: 60 mg via INTRAVENOUS

## 2016-04-17 MED ORDER — ALUM & MAG HYDROXIDE-SIMETH 200-200-20 MG/5ML PO SUSP: 30.0000 mL | Freq: Four times a day (QID) | ORAL | Status: DC | PRN

## 2016-04-17 MED ORDER — HYDROCHLOROTHIAZIDE 25 MG PO TABS: 25.0000 mg | ORAL_TABLET | Freq: Every day | ORAL | Status: DC

## 2016-04-17 MED ORDER — PROMETHAZINE HCL 25 MG/ML IJ SOLN: 6.2500 mg | INTRAMUSCULAR | Status: DC | PRN

## 2016-04-17 MED ORDER — POVIDONE-IODINE 7.5 % EX SOLN
Freq: Once | CUTANEOUS | Status: DC
  Filled 2016-04-17: qty 118

## 2016-04-17 MED ORDER — FLEET ENEMA 7-19 GM/118ML RE ENEM: 1.0000 | ENEMA | Freq: Once | RECTAL | Status: DC | PRN

## 2016-04-17 MED ORDER — 0.9 % SODIUM CHLORIDE (POUR BTL) OPTIME
TOPICAL | Status: DC | PRN
  Administered 2016-04-17: 1000 mL

## 2016-04-17 MED ORDER — ACETAMINOPHEN 325 MG PO TABS: 650.0000 mg | ORAL_TABLET | ORAL | Status: DC | PRN

## 2016-04-17 MED ORDER — SUGAMMADEX SODIUM 500 MG/5ML IV SOLN
INTRAVENOUS | Status: DC | PRN
  Administered 2016-04-17: 229.6 mg via INTRAVENOUS

## 2016-04-17 MED ORDER — MENTHOL 3 MG MT LOZG: 1.0000 | LOZENGE | OROMUCOSAL | Status: DC | PRN

## 2016-04-17 MED ORDER — GABAPENTIN 300 MG PO CAPS
300.0000 mg | ORAL_CAPSULE | Freq: Three times a day (TID) | ORAL | Status: DC
  Filled 2016-04-17: qty 1

## 2016-04-17 MED ORDER — CITALOPRAM HYDROBROMIDE 40 MG PO TABS
40.0000 mg | ORAL_TABLET | Freq: Every day | ORAL | Status: DC
  Administered 2016-04-17: 40 mg via ORAL
  Filled 2016-04-17: qty 1

## 2016-04-17 MED ORDER — MORPHINE SULFATE (PF) 2 MG/ML IV SOLN
1.0000 mg | INTRAVENOUS | Status: DC | PRN
  Administered 2016-04-17: 2 mg via INTRAVENOUS
  Filled 2016-04-17: qty 1

## 2016-04-17 MED ORDER — ONDANSETRON HCL 4 MG/2ML IJ SOLN: 4.0000 mg | INTRAMUSCULAR | Status: DC | PRN

## 2016-04-17 MED ORDER — MIDAZOLAM HCL 2 MG/2ML IJ SOLN
INTRAMUSCULAR | Status: AC
  Filled 2016-04-17: qty 2

## 2016-04-17 MED ORDER — DIAZEPAM 5 MG PO TABS
5.0000 mg | ORAL_TABLET | Freq: Four times a day (QID) | ORAL | Status: DC | PRN
Start: 2016-04-17 — End: 2016-04-18

## 2016-04-17 MED ORDER — PHENOL 1.4 % MT LIQD: 1.0000 | OROMUCOSAL | Status: DC | PRN

## 2016-04-17 MED ORDER — SODIUM CHLORIDE 0.9% FLUSH: 3.0000 mL | INTRAVENOUS | Status: DC | PRN

## 2016-04-17 MED ORDER — PROPOFOL 10 MG/ML IV BOLUS
INTRAVENOUS | Status: AC
  Filled 2016-04-17: qty 20

## 2016-04-17 MED ORDER — METFORMIN HCL 500 MG PO TABS
500.0000 mg | ORAL_TABLET | Freq: Every day | ORAL | Status: DC
  Administered 2016-04-17: 500 mg via ORAL
  Filled 2016-04-17: qty 1

## 2016-04-17 MED ORDER — BISACODYL 5 MG PO TBEC: 5.0000 mg | DELAYED_RELEASE_TABLET | Freq: Every day | ORAL | Status: DC | PRN

## 2016-04-17 MED ORDER — LACTATED RINGERS IV SOLN
INTRAVENOUS | Status: DC
  Administered 2016-04-17 (×2): via INTRAVENOUS

## 2016-04-17 MED ORDER — LACTATED RINGERS IV SOLN
INTRAVENOUS | Status: DC
  Administered 2016-04-17 (×2): via INTRAVENOUS

## 2016-04-17 MED ORDER — OXYCODONE-ACETAMINOPHEN 5-325 MG PO TABS
1.0000 | ORAL_TABLET | ORAL | Status: DC | PRN
  Administered 2016-04-17 – 2016-04-18 (×2): 2 via ORAL
  Filled 2016-04-17 (×2): qty 2

## 2016-04-17 MED ORDER — VITAMIN D (ERGOCALCIFEROL) 1.25 MG (50000 UNIT) PO CAPS: 50000.0000 [IU] | ORAL_CAPSULE | ORAL | Status: DC

## 2016-04-17 MED ORDER — LISINOPRIL 20 MG PO TABS: 40.0000 mg | ORAL_TABLET | Freq: Every day | ORAL | Status: DC

## 2016-04-17 MED ORDER — BUPIVACAINE-EPINEPHRINE (PF) 0.25% -1:200000 IJ SOLN
INTRAMUSCULAR | Status: AC
  Filled 2016-04-17: qty 30

## 2016-04-17 MED ORDER — THROMBIN 20000 UNITS EX KIT
PACK | CUTANEOUS | Status: DC | PRN
  Administered 2016-04-17: 20000 [IU] via TOPICAL

## 2016-04-17 MED ORDER — PHENYLEPHRINE HCL 10 MG/ML IJ SOLN
10.0000 mg | INTRAVENOUS | Status: DC | PRN
  Administered 2016-04-17: 40 ug/min via INTRAVENOUS
  Administered 2016-04-17: 25 ug/min via INTRAVENOUS

## 2016-04-17 MED ORDER — FENTANYL CITRATE (PF) 250 MCG/5ML IJ SOLN
INTRAMUSCULAR | Status: AC
  Filled 2016-04-17: qty 5

## 2016-04-17 MED ORDER — ACETAMINOPHEN 650 MG RE SUPP: 650.0000 mg | RECTAL | Status: DC | PRN

## 2016-04-17 MED ORDER — VECURONIUM BROMIDE 10 MG IV SOLR
INTRAVENOUS | Status: DC | PRN
  Administered 2016-04-17 (×5): 2 mg via INTRAVENOUS
  Administered 2016-04-17: 3 mg via INTRAVENOUS

## 2016-04-17 MED ORDER — CEFAZOLIN IN D5W 1 GM/50ML IV SOLN
1.0000 g | Freq: Three times a day (TID) | INTRAVENOUS | Status: AC
  Administered 2016-04-17 – 2016-04-18 (×2): 1 g via INTRAVENOUS
  Filled 2016-04-17 (×3): qty 50

## 2016-04-17 MED ORDER — SENNOSIDES-DOCUSATE SODIUM 8.6-50 MG PO TABS: 1.0000 | ORAL_TABLET | Freq: Every evening | ORAL | Status: DC | PRN

## 2016-04-17 MED ORDER — LIDOCAINE HCL (CARDIAC) 20 MG/ML IV SOLN: INTRAVENOUS | Status: DC | PRN

## 2016-04-17 MED ORDER — HYDROMORPHONE HCL 1 MG/ML IJ SOLN
INTRAMUSCULAR | Status: AC
Start: 2016-04-17 — End: 2016-04-18
  Filled 2016-04-17: qty 1

## 2016-04-17 MED ORDER — HYDROMORPHONE HCL 1 MG/ML IJ SOLN
0.2500 mg | INTRAMUSCULAR | Status: DC | PRN
  Administered 2016-04-17 (×2): 0.5 mg via INTRAVENOUS

## 2016-04-17 MED ORDER — SODIUM CHLORIDE 0.9 % IV SOLN: 250.0000 mL | INTRAVENOUS | Status: DC

## 2016-04-17 MED ORDER — ATORVASTATIN CALCIUM 10 MG PO TABS: 20.0000 mg | ORAL_TABLET | Freq: Every day | ORAL | Status: DC

## 2016-04-17 MED ORDER — THROMBIN 20000 UNITS EX SOLR
CUTANEOUS | Status: AC
  Filled 2016-04-17: qty 20000

## 2016-04-17 MED ORDER — SODIUM CHLORIDE 0.9% FLUSH: 3.0000 mL | Freq: Two times a day (BID) | INTRAVENOUS | Status: DC

## 2016-04-17 MED ORDER — DOCUSATE SODIUM 100 MG PO CAPS
100.0000 mg | ORAL_CAPSULE | Freq: Two times a day (BID) | ORAL | Status: DC
  Administered 2016-04-17: 100 mg via ORAL
  Filled 2016-04-17: qty 1

## 2016-04-17 MED ORDER — HYDROMORPHONE HCL 1 MG/ML IJ SOLN
INTRAMUSCULAR | Status: AC
  Filled 2016-04-17: qty 1

## 2016-04-17 MED ORDER — MINERAL OIL LIGHT 100 % EX OIL
TOPICAL_OIL | CUTANEOUS | Status: AC
  Filled 2016-04-17: qty 25

## 2016-04-17 MED ORDER — PHENYLEPHRINE 40 MCG/ML (10ML) SYRINGE FOR IV PUSH (FOR BLOOD PRESSURE SUPPORT)
PREFILLED_SYRINGE | INTRAVENOUS | Status: DC | PRN
  Administered 2016-04-17 (×2): 80 ug via INTRAVENOUS

## 2016-04-17 MED ORDER — FENTANYL CITRATE (PF) 100 MCG/2ML IJ SOLN
INTRAMUSCULAR | Status: DC | PRN
  Administered 2016-04-17 (×3): 50 ug via INTRAVENOUS
  Administered 2016-04-17: 100 ug via INTRAVENOUS
  Administered 2016-04-17 (×2): 50 ug via INTRAVENOUS

## 2016-04-17 MED ORDER — AMPHETAMINE-DEXTROAMPHETAMINE 10 MG PO TABS
20.0000 mg | ORAL_TABLET | Freq: Two times a day (BID) | ORAL | Status: DC
  Filled 2016-04-17: qty 2

## 2016-04-17 MED ORDER — MIDAZOLAM HCL 5 MG/5ML IJ SOLN
INTRAMUSCULAR | Status: DC | PRN
  Administered 2016-04-17 (×2): 1 mg via INTRAVENOUS

## 2016-04-17 MED ORDER — ALPRAZOLAM 0.5 MG PO TABS
1.0000 mg | ORAL_TABLET | Freq: Two times a day (BID) | ORAL | Status: DC
Start: 2016-04-17 — End: 2016-04-18
  Administered 2016-04-17: 1 mg via ORAL
  Filled 2016-04-17 (×2): qty 2

## 2016-04-17 MED ORDER — MEPERIDINE HCL 25 MG/ML IJ SOLN: 6.2500 mg | INTRAMUSCULAR | Status: DC | PRN

## 2016-04-17 SURGICAL SUPPLY — 75 items
APL SKNCLS STERI-STRIP NONHPOA (GAUZE/BANDAGES/DRESSINGS) ×1
BENZOIN TINCTURE PRP APPL 2/3 (GAUZE/BANDAGES/DRESSINGS) ×2 IMPLANT
BIT DRILL NEURO 2X3.1 SFT TUCH (MISCELLANEOUS) ×1 IMPLANT
BIT DRILL SRG 14X2.2XFLT CHK (BIT) IMPLANT
BIT DRL SRG 14X2.2XFLT CHK (BIT) ×1
BLADE SURG 15 STRL LF DISP TIS (BLADE) ×1 IMPLANT
BLADE SURG 15 STRL SS (BLADE) ×2
BLADE SURG ROTATE 9660 (MISCELLANEOUS) ×2 IMPLANT
BUR MATCHSTICK NEURO 3.0 LAGG (BURR) IMPLANT
CARTRIDGE OIL MAESTRO DRILL (MISCELLANEOUS) ×1 IMPLANT
COVER SURGICAL LIGHT HANDLE (MISCELLANEOUS) ×2 IMPLANT
CRADLE DONUT ADULT HEAD (MISCELLANEOUS) ×2 IMPLANT
DECANTER SPIKE VIAL GLASS SM (MISCELLANEOUS) ×2 IMPLANT
DIFFUSER DRILL AIR PNEUMATIC (MISCELLANEOUS) ×2 IMPLANT
DRAIN JACKSON RD 7FR 3/32 (WOUND CARE) IMPLANT
DRAPE C-ARM 42X72 X-RAY (DRAPES) ×2 IMPLANT
DRAPE POUCH INSTRU U-SHP 10X18 (DRAPES) ×2 IMPLANT
DRAPE SURG 17X23 STRL (DRAPES) ×6 IMPLANT
DRILL BIT SKYLINE 14MM (BIT) ×2
DRILL NEURO 2X3.1 SOFT TOUCH (MISCELLANEOUS) ×4
DURAPREP 26ML APPLICATOR (WOUND CARE) ×2 IMPLANT
ELECT COATED BLADE 2.86 ST (ELECTRODE) ×2 IMPLANT
ELECT REM PT RETURN 9FT ADLT (ELECTROSURGICAL) ×2
ELECTRODE REM PT RTRN 9FT ADLT (ELECTROSURGICAL) ×1 IMPLANT
EVACUATOR SILICONE 100CC (DRAIN) IMPLANT
GAUZE SPONGE 4X4 12PLY STRL (GAUZE/BANDAGES/DRESSINGS) ×2 IMPLANT
GAUZE SPONGE 4X4 16PLY XRAY LF (GAUZE/BANDAGES/DRESSINGS) ×3 IMPLANT
GLOVE BIO SURGEON STRL SZ7 (GLOVE) ×2 IMPLANT
GLOVE BIO SURGEON STRL SZ8 (GLOVE) ×2 IMPLANT
GLOVE BIOGEL PI IND STRL 7.0 (GLOVE) ×2 IMPLANT
GLOVE BIOGEL PI IND STRL 8 (GLOVE) ×1 IMPLANT
GLOVE BIOGEL PI INDICATOR 7.0 (GLOVE) ×2
GLOVE BIOGEL PI INDICATOR 8 (GLOVE) ×1
GOWN STRL REUS W/ TWL LRG LVL3 (GOWN DISPOSABLE) ×1 IMPLANT
GOWN STRL REUS W/ TWL XL LVL3 (GOWN DISPOSABLE) ×1 IMPLANT
GOWN STRL REUS W/TWL LRG LVL3 (GOWN DISPOSABLE) ×2
GOWN STRL REUS W/TWL XL LVL3 (GOWN DISPOSABLE) ×2
INTERLOCK LRDTC CRVCL VBR 7MM (Bone Implant) IMPLANT
INTERLOCK LRDTC CRVCL VBR 8MM (Peek) IMPLANT
IV CATH 14GX2 1/4 (CATHETERS) ×2 IMPLANT
KIT BASIN OR (CUSTOM PROCEDURE TRAY) ×2 IMPLANT
KIT ROOM TURNOVER OR (KITS) ×2 IMPLANT
LORDOTIC CERVICAL VBR 7MM SM (Bone Implant) ×4 IMPLANT
LORDOTIC CERVICAL VBR 8MM SM (Peek) ×4 IMPLANT
MANIFOLD NEPTUNE II (INSTRUMENTS) ×2 IMPLANT
NDL SPNL 20GX3.5 QUINCKE YW (NEEDLE) ×1 IMPLANT
NEEDLE 27GAX1X1/2 (NEEDLE) ×2 IMPLANT
NEEDLE SPNL 20GX3.5 QUINCKE YW (NEEDLE) ×2 IMPLANT
NS IRRIG 1000ML POUR BTL (IV SOLUTION) ×2 IMPLANT
OIL CARTRIDGE MAESTRO DRILL (MISCELLANEOUS) ×2
PACK ORTHO CERVICAL (CUSTOM PROCEDURE TRAY) ×2 IMPLANT
PAD ARMBOARD 7.5X6 YLW CONV (MISCELLANEOUS) ×4 IMPLANT
PATTIES SURGICAL .5 X.5 (GAUZE/BANDAGES/DRESSINGS) IMPLANT
PATTIES SURGICAL .5 X1 (DISPOSABLE) IMPLANT
PIN DISTRACTION 14 (PIN) ×3 IMPLANT
PLATE SKYLINE 72MM SPINAL (Plate) ×1 IMPLANT
PUTTY BONE DBX 5CC MIX (Putty) ×1 IMPLANT
SCREW SKYLINE VAR OS 14MM (Screw) ×10 IMPLANT
SPONGE INTESTINAL PEANUT (DISPOSABLE) ×4 IMPLANT
SPONGE LAP 4X18 X RAY DECT (DISPOSABLE) ×1 IMPLANT
SPONGE SURGIFOAM ABS GEL 100 (HEMOSTASIS) ×2 IMPLANT
STRIP CLOSURE SKIN 1/2X4 (GAUZE/BANDAGES/DRESSINGS) ×2 IMPLANT
SURGIFLO W/THROMBIN 8M KIT (HEMOSTASIS) IMPLANT
SUT MNCRL AB 4-0 PS2 18 (SUTURE) ×2 IMPLANT
SUT VIC AB 2-0 CT2 18 VCP726D (SUTURE) ×2 IMPLANT
SYR BULB IRRIGATION 50ML (SYRINGE) ×2 IMPLANT
SYR CONTROL 10ML LL (SYRINGE) ×4 IMPLANT
TAPE CLOTH 4X10 WHT NS (GAUZE/BANDAGES/DRESSINGS) ×2 IMPLANT
TAPE CLOTH SURG 4X10 WHT LF (GAUZE/BANDAGES/DRESSINGS) ×1 IMPLANT
TAPE UMBILICAL COTTON 1/8X30 (MISCELLANEOUS) ×4 IMPLANT
TOWEL OR 17X24 6PK STRL BLUE (TOWEL DISPOSABLE) ×2 IMPLANT
TOWEL OR 17X26 10 PK STRL BLUE (TOWEL DISPOSABLE) ×2 IMPLANT
TRAY FOLEY CATH 16FRSI W/METER (SET/KITS/TRAYS/PACK) ×2 IMPLANT
WATER STERILE IRR 1000ML POUR (IV SOLUTION) ×2 IMPLANT
YANKAUER SUCT BULB TIP NO VENT (SUCTIONS) ×2 IMPLANT
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
## 2016-04-17 NOTE — Anesthesia Procedure Notes (Signed)
Procedure Name: Intubation Date/Time: 04/17/2016 2:34 PM Performed by: Bobbie StackANDERSON, Joseph Shepard Pre-anesthesia Checklist: Patient identified, Emergency Drugs available, Suction available, Patient being monitored and Timeout performed Patient Re-evaluated:Patient Re-evaluated prior to inductionOxygen Delivery Method: Circle system utilized Preoxygenation: Pre-oxygenation with 100% oxygen Intubation Type: IV induction Ventilation: Mask ventilation with difficulty and Oral airway inserted - appropriate to patient size Laryngoscope Size: 3 Grade View: Grade I Tube type: Oral Tube size: 7.5 mm Number of attempts: 1 Airway Equipment and Method: Rigid stylet and Video-laryngoscopy Placement Confirmation: ETT inserted through vocal cords under direct vision,  positive ETCO2 and breath sounds checked- equal and bilateral Secured at: 23 cm Tube secured with: Tape Dental Injury: Teeth and Oropharynx as per pre-operative assessment  Difficulty Due To: Difficulty was anticipated, Difficult Airway- due to large tongue and Difficult Airway- due to reduced neck mobility

## 2016-04-17 NOTE — Progress Notes (Signed)
Arrived from PACU. Alert and oriented. Rt shoulder pain 10/10. Dressing CDI with aspen brace in placed. Oriented to room.  Call light within reach.

## 2016-04-17 NOTE — Transfer of Care (Signed)
Immediate Anesthesia Transfer of Care Note  Patient: Joseph Shepard  Procedure(s) Performed: Procedure(s): ANTERIOR CERVICAL DECOMPRESSION FUSION CERVICAL 3-4, CERVICAL 4-5, CERVICAL 5-6, CERVICAL 6-7 WITH INSTRUMENTATION AND ALLOGRAFT (N/A)  Patient Location: PACU  Anesthesia Type:General  Level of Consciousness: awake, alert , sedated and patient cooperative  Airway & Oxygen Therapy: Patient Spontanous Breathing and Patient connected to nasal cannula oxygen  Post-op Assessment: Report given to RN and Post -op Vital signs reviewed and stable  Post vital signs: Reviewed and stable  Last Vitals:  Filed Vitals:   04/17/16 1136 04/17/16 1855  BP: 132/84 142/69  Pulse: 57 83  Temp: 36.9 C 36.6 C  Resp: 20 18    Last Pain: There were no vitals filed for this visit.       Complications: No apparent anesthesia complications

## 2016-04-17 NOTE — Anesthesia Preprocedure Evaluation (Addendum)
Anesthesia Evaluation  Patient identified by MRN, date of birth, ID band Patient awake    Reviewed: Allergy & Precautions, NPO status , Patient's Chart, lab work & pertinent test results  Airway Mallampati: I  TM Distance: >3 FB Neck ROM: Full    Dental  (+) Teeth Intact, Dental Advisory Given   Pulmonary sleep apnea , Current Smoker,    breath sounds clear to auscultation       Cardiovascular hypertension, Pt. on medications  Rhythm:Regular Rate:Normal     Neuro/Psych PSYCHIATRIC DISORDERS Anxiety Depression negative neurological ROS     GI/Hepatic PUD, GERD  Medicated,  Endo/Other  diabetes, Type 2, Oral Hypoglycemic Agents  Renal/GU Renal disease  negative genitourinary   Musculoskeletal  (+) Arthritis ,   Abdominal (+) + obese,   Peds negative pediatric ROS (+)  Hematology negative hematology ROS (+)   Anesthesia Other Findings   Reproductive/Obstetrics negative OB ROS                            Lab Results  Component Value Date   WBC 10.6* 04/10/2016   HGB 14.0 04/10/2016   HCT 40.8 04/10/2016   MCV 86.3 04/10/2016   PLT 223 04/10/2016   Lab Results  Component Value Date   CREATININE 0.85 04/10/2016   BUN 17 04/10/2016   NA 137 04/10/2016   K 3.5 04/10/2016   CL 105 04/10/2016   CO2 24 04/10/2016   Lab Results  Component Value Date   INR 0.99 04/10/2016     Anesthesia Physical Anesthesia Plan  ASA: III  Anesthesia Plan: General   Post-op Pain Management:    Induction: Intravenous  Airway Management Planned: Video Laryngoscope Planned and Oral ETT  Additional Equipment:   Intra-op Plan:   Post-operative Plan: Extubation in OR  Informed Consent: I have reviewed the patients History and Physical, chart, labs and discussed the procedure including the risks, benefits and alternatives for the proposed anesthesia with the patient or authorized representative  who has indicated his/her understanding and acceptance.   Dental advisory given  Plan Discussed with: CRNA  Anesthesia Plan Comments:         Anesthesia Quick Evaluation

## 2016-04-17 NOTE — Anesthesia Postprocedure Evaluation (Signed)
Anesthesia Post Note  Patient: Joseph Shepard  Procedure(s) Performed: Procedure(s) (LRB): ANTERIOR CERVICAL DECOMPRESSION FUSION CERVICAL 3-4, CERVICAL 4-5, CERVICAL 5-6, CERVICAL 6-7 WITH INSTRUMENTATION AND ALLOGRAFT (N/A)  Patient location during evaluation: PACU Anesthesia Type: General Level of consciousness: awake and alert Pain management: pain level controlled Vital Signs Assessment: post-procedure vital signs reviewed and stable Respiratory status: spontaneous breathing, nonlabored ventilation, respiratory function stable and patient connected to nasal cannula oxygen Cardiovascular status: blood pressure returned to baseline and stable Postop Assessment: no signs of nausea or vomiting Anesthetic complications: no    Last Vitals:  Filed Vitals:   04/17/16 1949 04/17/16 2008  BP: 135/71 140/75  Pulse: 77 72  Temp: 36.6 C 36.7 C  Resp: 15 18    Last Pain:  Filed Vitals:   04/17/16 2030  PainSc: 10-Worst pain ever                 Randie Tallarico,W. EDMOND

## 2016-04-18 LAB — GLUCOSE, CAPILLARY: Glucose-Capillary: 169 mg/dL — ABNORMAL HIGH (ref 65–99)

## 2016-04-18 MED FILL — Thrombin For Soln 20000 Unit: CUTANEOUS | Qty: 1 | Status: AC

## 2016-04-18 NOTE — Progress Notes (Signed)
   04/18/16 0010  BiPAP/CPAP/SIPAP  BiPAP/CPAP/SIPAP Pt Type Adult  Mask Type Full face mask  Mask Size Large  Set Rate 0 breaths/min  Respiratory Rate 18 breaths/min  IPAP 18 cmH20  EPAP 8 cmH2O  Oxygen Percent 21 %  Flow Rate 0 lpm  BiPAP/CPAP/SIPAP CPAP  Patient Home Equipment No  Auto Titrate Yes  BiPAP/CPAP /SiPAP Vitals  Resp 18  Patient placed on CPAP Auto Mode I Max=18 and E Min =8. He tolerates it very well at this time.

## 2016-04-18 NOTE — Progress Notes (Signed)
Discharge orders received.  Discharge instructions and follow-up appointments reviewed with the patient.  VSS upon discharge.  IV removed and education complete.  Transported out via wheelchair.   Chenille Toor M, RN 

## 2016-04-18 NOTE — Progress Notes (Signed)
    Patient doing well Denies R or L arm pain   Physical Exam: Filed Vitals:   04/18/16 0148 04/18/16 0541  BP: 131/67 124/72  Pulse: 66 65  Temp:  97.8 F (36.6 C)  Resp: 20 18   Neck soft/supple Dressing in place NVI  POD #1 s/p C3-C7 ACDF doing well  - encourage ambulation (I appreciate nursing notes regarding ambulation - thank you) - Percocet for pain, Valium for muscle spasms - likely d/c home today with f/u in 2 weeks

## 2016-04-18 NOTE — Progress Notes (Signed)
Ambulated in hallway without assistive device. Tolerated well.

## 2016-04-21 ENCOUNTER — Encounter (HOSPITAL_COMMUNITY): Payer: Self-pay | Admitting: Orthopedic Surgery

## 2016-05-01 NOTE — Discharge Summary (Signed)
Patient ID: Joseph AgeeJeffrey H Gonsalves MRN: [id number] DOB/AGE: Oct 28, 1965 50 y.o.  Admit date: 04/17/2016 Discharge date: 04/18/2016  Admission Diagnoses:  Active Problems:   Radiculopathy   Discharge Diagnoses:  Same  Past Medical History  Diagnosis Date  . Hypertension   . High cholesterol   . Kidney stones   . Chronic back pain   . Multiple gastric ulcers   . Sleep apnea     CPAP  . Depression   . ADD (attention deficit disorder)   . GERD (gastroesophageal reflux disease)   . Heart murmur     "was told in early 20's that I had a heart murmur"  . History of pneumonia   . History of bronchitis as a child   . Diabetes mellitus without complication (HCC)     pre-diabetic; takes Metformin  . Anxiety   . Arthritis     Surgeries: Procedure(s): ANTERIOR CERVICAL DECOMPRESSION FUSION CERVICAL 3-4, CERVICAL 4-5, CERVICAL 5-6, CERVICAL 6-7 WITH INSTRUMENTATION AND ALLOGRAFT on 04/17/2016   Consultants:  None  Discharged Condition: Improved  Hospital Course: Joseph Shepard is an 50 y.o. male who was admitted 04/17/2016 for operative treatment of radiculopathy. Patient has severe unremitting pain that affects sleep, daily activities, and work/hobbies. After pre-op clearance the patient was taken to the operating room on 04/17/2016 and underwent  Procedure(s): ANTERIOR CERVICAL DECOMPRESSION FUSION CERVICAL 3-4, CERVICAL 4-5, CERVICAL 5-6, CERVICAL 6-7 WITH INSTRUMENTATION AND ALLOGRAFT.    Patient was given perioperative antibiotics:  Anti-infectives    Start     Dose/Rate Route Frequency Ordered Stop   04/17/16 2200  ceFAZolin (ANCEF) IVPB 1 g/50 mL premix     1 g 100 mL/hr over 30 Minutes Intravenous Every 8 hours 04/17/16 2001 04/18/16 0606   04/17/16 1345  ceFAZolin (ANCEF) IVPB 2g/100 mL premix     2 g 200 mL/hr over 30 Minutes Intravenous To [facility] 04/16/16 0943 04/17/16 1510       Patient was given sequential compression devices, early ambulation to prevent  DVT.  Patient benefited maximally from hospital stay and there were no complications.    Recent vital signs: BP 124/72 mmHg  Pulse 65  Temp(Src) 97.8 F (36.6 C) (Axillary)  Resp 18  Ht 5\' 9"  (1.753 m)  Wt 121.7 kg (268 lb 4.8 oz)  BMI 39.60 kg/m2  SpO2 96%  Discharge Medications:     Medication List    TAKE these medications        ALPRAZolam 1 MG tablet  Commonly known as:  XANAX  Take 1 mg by mouth 2 (two) times daily.     amphetamine-dextroamphetamine 20 MG tablet  Commonly known as:  ADDERALL  Take 20 mg by mouth 2 (two) times daily.     atorvastatin 20 MG tablet  Commonly known as:  LIPITOR  Take 20 mg by mouth daily.     citalopram 40 MG tablet  Commonly known as:  CELEXA  Take 40 mg by mouth daily.     famotidine 40 MG tablet  Commonly known as:  PEPCID  Take 40 mg by mouth daily.     gabapentin 300 MG capsule  Commonly known as:  NEURONTIN  Take 300 mg by mouth 3 (three) times daily.     hydrochlorothiazide 25 MG tablet  Commonly known as:  HYDRODIURIL  Take 25 mg by mouth daily.     lisinopril 40 MG tablet  Commonly known as:  PRINIVIL,ZESTRIL  Take 40 mg by mouth daily.  metFORMIN 500 MG tablet  Commonly known as:  GLUCOPHAGE  Take 500 mg by mouth every evening.     Vitamin D (Ergocalciferol) 50000 units Caps capsule  Commonly known as:  DRISDOL  Take 50,000 Units by mouth every Wednesday.        Diagnostic Studies: Dg Chest 2 View  04/10/2016  CLINICAL DATA:  Spine surgery. Preoperative chest x-ray. History shortness of breath . EXAM: CHEST  2 VIEW COMPARISON:  11/17/2013 report. FINDINGS: Mediastinum hilar structures are normal. Borderline cardiomegaly. No focal infiltrate. No pleural effusion or pneumothorax. No acute bony abnormality. IMPRESSION: 1. Borderline cardiomegaly. 2. No acute pulmonary disease. Electronically Signed   By: Maisie Fus  Register   On: 04/10/2016 13:44   Dg Cervical Spine 1 View  04/17/2016  CLINICAL DATA:  ACDF  EXAM: DG C-ARM 61-120 MIN; DG CERVICAL SPINE - 1 VIEW COMPARISON:  MRI 02/18/2016 FINDINGS: Single C-arm image shows anterior cervical discectomy and fusion from C3 to C7 with interbody spacers, anterior plate and screw fixation. Inferior aspect is not well seen because of shoulder density. Upper in midportion have a good appearance in this projection. IMPRESSION: ACDF C3-C7 Electronically Signed   By: Paulina Fusi M.D.   On: 04/17/2016 18:36   Dg C-arm 1-60 Min  04/17/2016  CLINICAL DATA:  ACDF EXAM: DG C-ARM 61-120 MIN; DG CERVICAL SPINE - 1 VIEW COMPARISON:  MRI 02/18/2016 FINDINGS: Single C-arm image shows anterior cervical discectomy and fusion from C3 to C7 with interbody spacers, anterior plate and screw fixation. Inferior aspect is not well seen because of shoulder density. Upper in midportion have a good appearance in this projection. IMPRESSION: ACDF C3-C7 Electronically Signed   By: Paulina Fusi M.D.   On: 04/17/2016 18:36    Disposition: 01-Home or Self Care   POD #1 s/p C3-C7 ACDF doing well  - encourage ambulation (I appreciate nursing notes regarding ambulation - thank you) - Percocet for pain, Valium for muscle spasms -Written scripts for pain signed and in chart -D/C instructions sheet printed and in chart -D/C today  -F/U in office 2 weeks   Signed: Georga Bora 05/01/2016, 12:34 PM

## 2016-08-28 ENCOUNTER — Other Ambulatory Visit: Payer: Self-pay | Admitting: Orthopedic Surgery

## 2016-08-28 DIAGNOSIS — M545 Low back pain, unspecified: Secondary | ICD-10-CM

## 2016-09-28 ENCOUNTER — Ambulatory Visit
Admission: RE | Admit: 2016-09-28 | Discharge: 2016-09-28 | Disposition: A | Discharge status: Home / Self Care | Payer: Medicare PPO | Source: Ambulatory Visit | Attending: Orthopedic Surgery | Admitting: Orthopedic Surgery

## 2016-09-28 DIAGNOSIS — M545 Low back pain, unspecified: Secondary | ICD-10-CM
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
## 2022-05-31 ENCOUNTER — Emergency Department (HOSPITAL_COMMUNITY): Payer: Medicare Other

## 2022-05-31 ENCOUNTER — Encounter (HOSPITAL_COMMUNITY): Payer: Self-pay

## 2022-05-31 ENCOUNTER — Observation Stay (HOSPITAL_COMMUNITY)
Admission: EM | Admit: 2022-05-31 | Discharge: 2022-06-03 | Disposition: A | Discharge status: Home / Self Care | Payer: Medicare Other | Attending: Family Medicine | Admitting: Family Medicine

## 2022-05-31 DIAGNOSIS — G8311 Monoplegia of lower limb affecting right dominant side: Secondary | ICD-10-CM | POA: Insufficient documentation

## 2022-05-31 DIAGNOSIS — E785 Hyperlipidemia, unspecified: Secondary | ICD-10-CM | POA: Diagnosis not present

## 2022-05-31 DIAGNOSIS — D72829 Elevated white blood cell count, unspecified: Secondary | ICD-10-CM | POA: Insufficient documentation

## 2022-05-31 DIAGNOSIS — E7800 Pure hypercholesterolemia, unspecified: Secondary | ICD-10-CM | POA: Insufficient documentation

## 2022-05-31 DIAGNOSIS — K219 Gastro-esophageal reflux disease without esophagitis: Secondary | ICD-10-CM | POA: Insufficient documentation

## 2022-05-31 DIAGNOSIS — E1140 Type 2 diabetes mellitus with diabetic neuropathy, unspecified: Secondary | ICD-10-CM | POA: Diagnosis not present

## 2022-05-31 DIAGNOSIS — Z794 Long term (current) use of insulin: Secondary | ICD-10-CM

## 2022-05-31 DIAGNOSIS — E1165 Type 2 diabetes mellitus with hyperglycemia: Secondary | ICD-10-CM | POA: Diagnosis not present

## 2022-05-31 DIAGNOSIS — Z1152 Encounter for screening for COVID-19: Secondary | ICD-10-CM | POA: Diagnosis not present

## 2022-05-31 DIAGNOSIS — F32A Depression, unspecified: Secondary | ICD-10-CM | POA: Diagnosis not present

## 2022-05-31 DIAGNOSIS — I10 Essential (primary) hypertension: Secondary | ICD-10-CM | POA: Diagnosis not present

## 2022-05-31 DIAGNOSIS — R531 Weakness: Principal | ICD-10-CM

## 2022-05-31 DIAGNOSIS — Z7984 Long term (current) use of oral hypoglycemic drugs: Secondary | ICD-10-CM | POA: Insufficient documentation

## 2022-05-31 DIAGNOSIS — E559 Vitamin D deficiency, unspecified: Secondary | ICD-10-CM | POA: Insufficient documentation

## 2022-05-31 DIAGNOSIS — G459 Transient cerebral ischemic attack, unspecified: Principal | ICD-10-CM | POA: Diagnosis present

## 2022-05-31 DIAGNOSIS — Z91148 Patient's other noncompliance with medication regimen for other reason: Secondary | ICD-10-CM

## 2022-05-31 DIAGNOSIS — G4730 Sleep apnea, unspecified: Secondary | ICD-10-CM | POA: Diagnosis not present

## 2022-05-31 DIAGNOSIS — F419 Anxiety disorder, unspecified: Secondary | ICD-10-CM | POA: Diagnosis present

## 2022-05-31 DIAGNOSIS — M4802 Spinal stenosis, cervical region: Secondary | ICD-10-CM | POA: Diagnosis present

## 2022-05-31 DIAGNOSIS — F988 Other specified behavioral and emotional disorders with onset usually occurring in childhood and adolescence: Secondary | ICD-10-CM | POA: Diagnosis not present

## 2022-05-31 DIAGNOSIS — M6281 Muscle weakness (generalized): Secondary | ICD-10-CM | POA: Insufficient documentation

## 2022-05-31 DIAGNOSIS — I639 Cerebral infarction, unspecified: Secondary | ICD-10-CM | POA: Diagnosis present

## 2022-05-31 DIAGNOSIS — G8929 Other chronic pain: Secondary | ICD-10-CM | POA: Diagnosis present

## 2022-05-31 DIAGNOSIS — M5412 Radiculopathy, cervical region: Secondary | ICD-10-CM | POA: Diagnosis not present

## 2022-05-31 DIAGNOSIS — Z79899 Other long term (current) drug therapy: Secondary | ICD-10-CM

## 2022-05-31 DIAGNOSIS — R4781 Slurred speech: Secondary | ICD-10-CM | POA: Diagnosis not present

## 2022-05-31 DIAGNOSIS — R2681 Unsteadiness on feet: Secondary | ICD-10-CM | POA: Diagnosis not present

## 2022-05-31 DIAGNOSIS — E871 Hypo-osmolality and hyponatremia: Secondary | ICD-10-CM | POA: Insufficient documentation

## 2022-05-31 DIAGNOSIS — R2689 Other abnormalities of gait and mobility: Secondary | ICD-10-CM | POA: Insufficient documentation

## 2022-05-31 DIAGNOSIS — Z720 Tobacco use: Secondary | ICD-10-CM | POA: Diagnosis present

## 2022-05-31 DIAGNOSIS — F17210 Nicotine dependence, cigarettes, uncomplicated: Secondary | ICD-10-CM | POA: Diagnosis not present

## 2022-05-31 DIAGNOSIS — T383X6A Underdosing of insulin and oral hypoglycemic [antidiabetic] drugs, initial encounter: Secondary | ICD-10-CM | POA: Diagnosis not present

## 2022-05-31 DIAGNOSIS — Z20822 Contact with and (suspected) exposure to covid-19: Secondary | ICD-10-CM | POA: Diagnosis present

## 2022-05-31 DIAGNOSIS — G8321 Monoplegia of upper limb affecting right dominant side: Secondary | ICD-10-CM | POA: Diagnosis not present

## 2022-05-31 DIAGNOSIS — Z981 Arthrodesis status: Secondary | ICD-10-CM | POA: Diagnosis not present

## 2022-05-31 LAB — URINALYSIS, ROUTINE W REFLEX MICROSCOPIC
Bacteria, UA: NONE SEEN
Bilirubin Urine: NEGATIVE
Glucose, UA: >=500 mg/dL — AB
Hgb urine dipstick: NEGATIVE
Ketones, ur: NEGATIVE mg/dL
Leukocytes,Ua: NEGATIVE
Nitrite: NEGATIVE
Protein, ur: NEGATIVE mg/dL
Specific Gravity, Urine: 1.037 — ABNORMAL HIGH (ref 1.005–1.030)
pH: 6 (ref 5.0–8.0)

## 2022-05-31 LAB — CBC
HCT: 42.6 % (ref 39.0–52.0)
Hemoglobin: 15.5 g/dL (ref 13.0–17.0)
MCH: 29.4 pg (ref 26.0–34.0)
MCHC: 36.4 g/dL — ABNORMAL HIGH (ref 30.0–36.0)
MCV: 80.7 fL (ref 80.0–100.0)
Platelets: 308 10*3/uL (ref 150–400)
RBC: 5.28 MIL/uL (ref 4.22–5.81)
RDW: 14.3 % (ref 11.5–15.5)
WBC: 15.2 10*3/uL — ABNORMAL HIGH (ref 4.0–10.5)
nRBC: 0 % (ref 0.0–0.2)

## 2022-05-31 LAB — BASIC METABOLIC PANEL
Anion gap: 9 (ref 5–15)
BUN: 11 mg/dL (ref 6–20)
CO2: 24 mmol/L (ref 22–32)
Calcium: 9.2 mg/dL (ref 8.9–10.3)
Chloride: 98 mmol/L (ref 98–111)
Creatinine, Ser: 0.57 mg/dL — ABNORMAL LOW (ref 0.61–1.24)
GFR, Estimated: >60 mL/min (ref >60)
Glucose, Bld: 415 mg/dL — ABNORMAL HIGH (ref 70–99)
Potassium: 4 mmol/L (ref 3.5–5.1)
Sodium: 131 mmol/L — ABNORMAL LOW (ref 135–145)

## 2022-05-31 LAB — CBG MONITORING, ED
Glucose-Capillary: 384 mg/dL — ABNORMAL HIGH (ref 70–99)
Glucose-Capillary: 229 mg/dL — ABNORMAL HIGH (ref 70–99)

## 2022-05-31 LAB — HEMOGLOBIN A1C
Hgb A1c MFr Bld: 14.3 % — ABNORMAL HIGH (ref 4.8–5.6)
Mean Plasma Glucose: 363.71 mg/dL

## 2022-05-31 LAB — HIV ANTIBODY (ROUTINE TESTING W REFLEX): HIV Screen 4th Generation wRfx: NONREACTIVE

## 2022-05-31 LAB — SARS CORONAVIRUS 2 BY RT PCR: SARS Coronavirus 2 by RT PCR: NEGATIVE

## 2022-05-31 MED ORDER — SODIUM CHLORIDE 0.9 % IV BOLUS
1000.0000 mL | Freq: Once | INTRAVENOUS | Status: AC
  Administered 2022-05-31: 1000 mL via INTRAVENOUS

## 2022-05-31 MED ORDER — STROKE: EARLY STAGES OF RECOVERY BOOK
Freq: Once | Status: AC
  Filled 2022-05-31: qty 1

## 2022-05-31 MED ORDER — ASPIRIN 300 MG RE SUPP: 300.0000 mg | Freq: Every day | RECTAL | Status: DC

## 2022-05-31 MED ORDER — ACETAMINOPHEN 160 MG/5ML PO SOLN: 650.0000 mg | ORAL | Status: DC | PRN

## 2022-05-31 MED ORDER — SODIUM CHLORIDE 0.9 % IV SOLN
INTRAVENOUS | Status: AC
Start: 2022-05-31 — End: 2022-06-01

## 2022-05-31 MED ORDER — INSULIN GLARGINE-YFGN 100 UNIT/ML ~~LOC~~ SOLN
20.0000 [IU] | Freq: Every day | SUBCUTANEOUS | Status: DC
  Administered 2022-05-31 – 2022-06-01 (×2): 20 [IU] via SUBCUTANEOUS
  Filled 2022-05-31 (×2): qty 0.2

## 2022-05-31 MED ORDER — ACETAMINOPHEN 325 MG PO TABS: 650.0000 mg | ORAL_TABLET | ORAL | Status: DC | PRN

## 2022-05-31 MED ORDER — INSULIN ASPART 100 UNIT/ML IJ SOLN
10.0000 [IU] | Freq: Once | INTRAMUSCULAR | Status: AC
  Administered 2022-05-31: 10 [IU] via SUBCUTANEOUS
  Filled 2022-05-31: qty 1

## 2022-05-31 MED ORDER — SENNOSIDES-DOCUSATE SODIUM 8.6-50 MG PO TABS: 1.0000 | ORAL_TABLET | Freq: Every evening | ORAL | Status: DC | PRN

## 2022-05-31 MED ORDER — ACETAMINOPHEN 650 MG RE SUPP: 650.0000 mg | RECTAL | Status: DC | PRN

## 2022-05-31 MED ORDER — ATORVASTATIN CALCIUM 40 MG PO TABS
40.0000 mg | ORAL_TABLET | Freq: Every day | ORAL | Status: DC
Start: 2022-05-31 — End: 2022-06-03
  Administered 2022-05-31 – 2022-06-02 (×3): 40 mg via ORAL
  Filled 2022-05-31 (×3): qty 1

## 2022-05-31 MED ORDER — HEPARIN SODIUM (PORCINE) 5000 UNIT/ML IJ SOLN
5000.0000 [IU] | Freq: Three times a day (TID) | INTRAMUSCULAR | Status: DC
  Administered 2022-05-31 – 2022-06-03 (×7): 5000 [IU] via SUBCUTANEOUS
  Filled 2022-05-31 (×7): qty 1

## 2022-05-31 MED ORDER — ASPIRIN 325 MG PO TABS
325.0000 mg | ORAL_TABLET | Freq: Every day | ORAL | Status: DC
  Administered 2022-05-31 – 2022-06-01 (×2): 325 mg via ORAL
  Filled 2022-05-31 (×2): qty 1

## 2022-05-31 NOTE — ED Notes (Signed)
Called ac for insulin

## 2022-05-31 NOTE — ED Triage Notes (Signed)
Pt arrives with fiance. Fiance states that pt has been very weak x several days, but has been worsening over the course of a year. Pt has been without insulin for over 6 months, and has lost over 100 lbs. Pt has had thirst. Pt endorses SHOB. Pt states he has pain from head to toes.

## 2022-05-31 NOTE — H&P (Signed)
History and Physical    Joseph Shepard:[id number] DOB: [birth year] DOA: 05/31/2022  PCP: Laurence Compton, MD (Inactive)  Patient coming from: Home  I have personally briefly reviewed patient's old medical records in [facility] Link  Chief Complaint: Weakness on the right side since Tuesday  HPI: Joseph Shepard is a 56 y.o. male with medical history significant of diabetes mellitus type 2 on metformin, depression, hyperlipidemia, GERD, neuropathic pain, hypertension, and vitamin D deficiency as well as anxiety who presented to the emergency department with a complaint of weakness since Tuesday.  Per the patient's girlfriend he has been very weak for several days and on Tuesday they noticed the weakness.  He has difficulty moving his right leg his right great toe and his right upper extremity.  He is very weak on that side.  Prior to Tuesday he was able to walk normally and use his right upper extremity normally.  He is right-handed.  According to his fiance he has been without insulin for over 6 months and has lost 100 pounds but is very thirsty.  Patient reports that he is short of breath and that he has pain all over.  ED Course: Sodium low at 131, glucose elevated at 415, creatinine 0.57, anion gap 9, GFR greater than 60, white blood cell count 15.2.  He is negative for COVID-19.  Glucose in urine is greater than 500 specific gravity is elevated.  CT scan of the head shows no acute intracranial findings seen on noncontrast CT scan of the brain.  Case was discussed with neurology who recommended admission to [facility] as MRI is not available at [facility] on the weekend.  Neurology would like to be consulted if MRI is abnormal and indicative of stroke.  Review of Systems: As per HPI otherwise all other systems reviewed and  negative.   Past Medical History:  Diagnosis Date   ADD (attention deficit disorder)    Anxiety    Arthritis    Chronic back pain    Depression     Diabetes mellitus without complication (HCC)    pre-diabetic; takes Metformin   GERD (gastroesophageal reflux disease)    Heart murmur    "was told in early 20's that I had a heart murmur"   High cholesterol    History of bronchitis as a child    History of pneumonia    Hypertension    Kidney stones    Multiple gastric ulcers    Sleep apnea    CPAP    Past Surgical History:  Procedure Laterality Date   ANTERIOR CERVICAL DECOMPRESSION/DISCECTOMY FUSION 4 LEVELS N/A 04/17/2016   Procedure: ANTERIOR CERVICAL DECOMPRESSION FUSION CERVICAL 3-4, CERVICAL 4-5, CERVICAL 5-6, CERVICAL 6-7 WITH INSTRUMENTATION AND ALLOGRAFT;  Surgeon: Estill Bamberg, MD;  Location: [facility] OR;  Service: Orthopedics;  Laterality: N/A;   BACK SURGERY     x3   CARDIAC CATHETERIZATION     ESOPHAGOGASTRODUODENOSCOPY     HERNIA REPAIR     TONSILLECTOMY      Social History   Social History Narrative   Not on file     reports that he has been smoking cigarettes. He has been smoking an average of .5 packs per day. He does not have any smokeless tobacco history on file. He reports current drug use. Drug: Marijuana. He reports that he does not drink alcohol.  No Known Allergies  History reviewed. No pertinent family history.   Prior to Admission medications   Medication Sig  Start Date End Date Taking? Authorizing Provider  ALPRAZolam Prudy Feeler) 1 MG tablet Take 1 mg by mouth 2 (two) times daily.     [provider]  amphetamine-dextroamphetamine (ADDERALL) 20 MG tablet Take 20 mg by mouth 2 (two) times daily.    [provider]  atorvastatin (LIPITOR) 20 MG tablet Take 20 mg by mouth daily.    [provider]  citalopram (CELEXA) 40 MG tablet Take 40 mg by mouth daily.    [provider]  famotidine (PEPCID) 40 MG tablet Take 40 mg by mouth daily.    [provider]  gabapentin (NEURONTIN) 300 MG capsule Take 300 mg by mouth 3 (three) times daily.    [provider]  hydrochlorothiazide (HYDRODIURIL) 25 MG tablet Take 25 mg by mouth daily.      [provider]  lisinopril (PRINIVIL,ZESTRIL) 40 MG tablet Take 40 mg by mouth daily.      [provider]  metFORMIN (GLUCOPHAGE) 500 MG tablet Take 500 mg by mouth every evening.    [provider]  Vitamin D, Ergocalciferol, (DRISDOL) 50000 units CAPS capsule Take 50,000 Units by mouth every Wednesday.     [provider]    Physical Exam:  Constitutional: NAD, calm, comfortable Vitals:   05/31/22 1436 05/31/22 1500 05/31/22 1530 05/31/22 1600  BP: (!) 122/94 (!) 123/103 (!) 128/94 (!) 135/98  Pulse: 77 75 76 75  Resp: 16 15 20 14   Temp:      TempSrc:      SpO2: 99% 98% 98% 95%  Weight:      Height:       Eyes: PERRL, lids and conjunctivae normal ENMT: Mucous membranes are moist. Posterior pharynx clear of any exudate or lesions.Normal dentition.  Neck: normal, supple, no masses, no thyromegaly Respiratory: clear to auscultation bilaterally, no wheezing, no crackles. Normal respiratory effort. No accessory muscle use.  Cardiovascular: Regular rate and rhythm, no murmurs / rubs / gallops. No extremity edema. 2+ pedal pulses. No carotid bruits.  Abdomen: no tenderness, no masses palpated. No hepatosplenomegaly. Bowel sounds positive.  Musculoskeletal: no clubbing / cyanosis. No joint deformity upper and lower extremities. Good ROM on left side poor range of motion on right side.  No contractures. Normal muscle tone.  Skin: no rashes, lesions, ulcers. No induration Neurologic: CN 2-12 grossly intact. Sensation intact, DTR hyperreflexic on the right normal on the left.  strength 5/5 in left 3/5 on right.  Speech is slurred with difficulty finding words    Labs on Admission: I have personally reviewed following labs and imaging studies  CBC: Recent Labs  Lab 05/31/22 1431  WBC 15.2*  HGB 15.5  HCT 42.6  MCV 80.7  PLT 308   Basic Metabolic Panel: Recent  Labs  Lab 05/31/22 1431  NA 131*  K 4.0  CL 98  CO2 24  GLUCOSE 415*  BUN 11  CREATININE 0.57*  CALCIUM 9.2   GFR: Estimated Creatinine Clearance: 110.3 mL/min (A) (by C-G formula based on SCr of 0.57 mg/dL (L)). Liver Function Tests: No results for input(s): "AST", "ALT", "ALKPHOS", "BILITOT", "PROT", "ALBUMIN" in the last 168 hours. No results for input(s): "LIPASE", "AMYLASE" in the last 168 hours. No results for input(s): "AMMONIA" in the last 168 hours. Coagulation Profile: No results for input(s): "INR", "PROTIME" in the last 168 hours. Cardiac Enzymes: No results for input(s): "CKTOTAL", "CKMB", "CKMBINDEX", "TROPONINI" in the last 168 hours. BNP (last 3 results) No results for input(s): "PROBNP"  in the last 8760 hours. HbA1C: No results for input(s): "HGBA1C" in the last 72 hours. CBG: Recent Labs  Lab 05/31/22 1409  GLUCAP 384*   Lipid Profile: No results for input(s): "CHOL", "HDL", "LDLCALC", "TRIG", "CHOLHDL", "LDLDIRECT" in the last 72 hours. Thyroid Function Tests: No results for input(s): "TSH", "T4TOTAL", "FREET4", "T3FREE", "THYROIDAB" in the last 72 hours. Anemia Panel: No results for input(s): "VITAMINB12", "FOLATE", "FERRITIN", "TIBC", "IRON", "RETICCTPCT" in the last 72 hours. Urine analysis:    Component Value Date/Time   COLORURINE YELLOW 04/10/2016 1050   APPEARANCEUR CLEAR 04/10/2016 1050   LABSPEC 1.022 04/10/2016 1050   PHURINE 6.0 04/10/2016 1050   GLUCOSEU NEGATIVE 04/10/2016 1050   HGBUR NEGATIVE 04/10/2016 1050   BILIRUBINUR NEGATIVE 04/10/2016 1050   KETONESUR NEGATIVE 04/10/2016 1050   PROTEINUR NEGATIVE 04/10/2016 1050   UROBILINOGEN 0.2 02/05/2013 1435   NITRITE NEGATIVE 04/10/2016 1050   LEUKOCYTESUR NEGATIVE 04/10/2016 1050    Radiological Exams on Admission: CT Head Wo Contrast  Result Date: 05/31/2022 CLINICAL DATA:  Neurological deficit, weakness EXAM: CT HEAD WITHOUT CONTRAST TECHNIQUE: Contiguous axial images were  obtained from the base of the skull through the vertex without intravenous contrast. RADIATION DOSE REDUCTION: This exam was performed according to the departmental dose-optimization program which includes automated exposure control, adjustment of the mA and/or kV according to patient size and/or use of iterative reconstruction technique. COMPARISON:  None Available. FINDINGS: Brain: No acute intracranial findings are seen. There are no signs of bleeding within the cranium. Ventricles are nondilated. Cortical sulci are prominent. Vascular: Unremarkable. Skull: Unremarkable. Sinuses/Orbits: Unremarkable. Other: None IMPRESSION: No acute intracranial findings are seen in noncontrast CT brain. Electronically Signed   By: Ernie Avena M.D.   On: 05/31/2022 15:47    EKG: Independently reviewed.  Normal sinus rhythm normal EKG  Assessment/Plan Principal Problem:   Stroke [facility]) Active Problems:   Uncontrolled type 2 diabetes mellitus with hyperglycemia, without long-term current use of insulin (HCC)   Hyponatremia   HTN (hypertension), benign   Hypercholesterolemia   Leukocytosis   Tobacco abuse   1.  Stroke: Symptoms became evident on Tuesday.  Patient did not wish to seek any medical help.  Finally today as he was unable to ambulate he agreed to come in.  He has right-sided symptoms.  Initial CT scan did not reveal any intracranial abnormalities.  Neurology was consulted who recommended transfer to [facility] for MRI and to consult neurology if MRI suggestive. -Patient is on a statin we will continue that -We will start aspirin 300 PR 325 p.o. pending swallow eval. -RN to do bedside swallow study to determine if patient can have diet and p.o. meds -Patient is a poorly controlled diabetic which may have contributed to stroke.  2.  Controlled type 2 diabetes with hyperglycemia without long-term use of insulin: Patient apparently ran out of insulin about 6 months ago.  Likely need more insulin  soon.  We will start him on some at bedtime Lantus.  Continue sliding scale insulin coverage without at bedtime coverage while he is in the hospital.  We will hold metformin while in the hospital.  hemoGlobin A1c is pending  3.  Hyponatremia: Likely related to long-term hyperglycemia.  Patient to be hydrated normal saline.  As blood glucose improves sodium level should improve.  Recheck in a.m.  4.  Hypertension: Blood pressures currently acceptable.  Await medication reconciliation prior to restarting medications.  Has been off a number of his medicines and I am concerned  about which are his accurate current medicines.  5.  Hypercholesterolemia: Patient has likely had a stroke we will restart his Lipitor but increase his dose to 40 mg daily as he was previously on 10mg  daily  6.  Leukocytosis: Etiology uncertain.  Will recheck in a.m.  7.  Tobacco abuse: We will start nicotine patch.  Patient will need to have counseling regarding tobacco cessation.  DVT prophylaxis: Subcu heparin Code Status: Full code Family Communication: No family present at the time of admission.  ED physician spoke with patient's girlfriend. Disposition Plan: We will likely need acute rehab or skilled rehab. Consults called: ED spoke with neurology no formal consult yet. Admission status: Inpatient   MD FACP [facility] Pager [phone]  How to contact the [facility] Attending or Consulting provider 7A - 7P or covering provider during after hours 7P -7A, for this patient?  Check the care team in [facility] and look for a) attending/consulting [facility] provider listed and b) the [facility] team listed Log into [email or web address] and use [place]'s universal password to access. If you do not have the password, please contact the hospital operator. Locate the [facility] provider you are looking for under [facility] and page to a number that you can be directly reached. If you still have difficulty reaching the  provider, please page the [facility] (Director on Call) for the Hospitalists listed on amion for assistance.  If 7PM-7AM, please contact night-coverage [email or web address] Password [facility]  05/31/2022, 6:00 PM

## 2022-05-31 NOTE — ED Provider Notes (Signed)
[facility] EMERGENCY DEPARTMENT Provider Note   CSN: [id number] Arrival date & time: 05/31/22  1355     History {Add pertinent medical, surgical, social history, OB history to HPI:1} Chief Complaint  Patient presents with   Weakness    Joseph Shepard is a 56 y.o. male.  The patient has a history of diabetes and hypertension.  He started with slurred speech and weakness in his right arm and right leg on Tuesday.  He has not had his insulin and a number of months.   Weakness      Home Medications Prior to Admission medications   Medication Sig Start Date End Date Taking? Authorizing Provider  ALPRAZolam Prudy Feeler) 1 MG tablet Take 1 mg by mouth 2 (two) times daily.     [provider]  amphetamine-dextroamphetamine (ADDERALL) 20 MG tablet Take 20 mg by mouth 2 (two) times daily.    [provider]  atorvastatin (LIPITOR) 20 MG tablet Take 20 mg by mouth daily.    [provider]  citalopram (CELEXA) 40 MG tablet Take 40 mg by mouth daily.    [provider]  famotidine (PEPCID) 40 MG tablet Take 40 mg by mouth daily.    [provider]  gabapentin (NEURONTIN) 300 MG capsule Take 300 mg by mouth 3 (three) times daily.    [provider]  hydrochlorothiazide (HYDRODIURIL) 25 MG tablet Take 25 mg by mouth daily.      [provider]  lisinopril (PRINIVIL,ZESTRIL) 40 MG tablet Take 40 mg by mouth daily.      [provider]  metFORMIN (GLUCOPHAGE) 500 MG tablet Take 500 mg by mouth every evening.    [provider]  Vitamin D, Ergocalciferol, (DRISDOL) 50000 units CAPS capsule Take 50,000 Units by mouth every Wednesday.     [provider]      Allergies    Patient has no known allergies.    Review of Systems   Review of Systems  Neurological:  Positive for weakness.    Physical Exam Updated Vital Signs BP (!) 135/98   Pulse 75   Temp 98 F (36.7 C) (Oral)   Resp 14   Ht 5\' 10"   (1.778 m)   Wt 77.3 kg   SpO2 95%   BMI 24.46 kg/m  Physical Exam  ED Results / Procedures / Treatments   Labs (all labs ordered are listed, but only abnormal results are displayed) Labs Reviewed  BASIC METABOLIC PANEL - Abnormal; Notable for the following components:      Result Value   Sodium 131 (*)    Glucose, Bld 415 (*)    Creatinine, Ser 0.57 (*)    All other components within normal limits  CBC - Abnormal; Notable for the following components:   WBC 15.2 (*)    MCHC 36.4 (*)    All other components within normal limits  CBG MONITORING, ED - Abnormal; Notable for the following components:   Glucose-Capillary 384 (*)    All other components within normal limits  SARS CORONAVIRUS 2 BY RT PCR  URINALYSIS, ROUTINE W REFLEX MICROSCOPIC  CBG MONITORING, ED    EKG EKG Interpretation  Date/Time:  Saturday May 31 2022 14:12:47 EDT Ventricular Rate:  89 PR Interval:  150 QRS Duration: 82 QT Interval:  364 QTC Calculation: 442 R Axis:   55 Text Interpretation: Normal sinus rhythm Normal ECG Confirmed by 05-21-1969 [phone]) on 05/31/2022 2:23:27 PM  Radiology CT Head Wo Contrast  Result Date: 05/31/2022 CLINICAL DATA:  Neurological deficit, weakness EXAM: CT HEAD WITHOUT CONTRAST TECHNIQUE: Contiguous axial images were obtained from the base of the skull through the vertex without intravenous contrast. RADIATION DOSE REDUCTION: This exam was performed according to the departmental dose-optimization program which includes automated exposure control, adjustment of the mA and/or kV according to patient size and/or use of iterative reconstruction technique. COMPARISON:  None Available. FINDINGS: Brain: No acute intracranial findings are seen. There are no signs of bleeding within the cranium. Ventricles are nondilated. Cortical sulci are prominent. Vascular: Unremarkable. Skull: Unremarkable. Sinuses/Orbits: Unremarkable. Other: None IMPRESSION: No acute intracranial findings  are seen in noncontrast CT brain. Electronically Signed   By: Ernie Avena M.D.   On: 05/31/2022 15:47    Procedures Procedures  {Document cardiac monitor, telemetry assessment procedure when appropriate:1}  Medications Ordered in ED Medications  sodium chloride 0.9 % bolus 1,000 mL (1,000 mLs Intravenous New Bag/Given 05/31/22 1540)  insulin aspart (novoLOG) injection 10 Units (10 Units Subcutaneous Given 05/31/22 1540)    ED Course/ Medical Decision Making/ A&P  05/31/2022.edcr CRITICAL CARE Performed by: Bethann Berkshire Total critical care time: 40 minutes Critical care time was exclusive of separately billable procedures and treating other patients. Critical care was necessary to treat or prevent imminent or life-threatening deterioration. Critical care was time spent personally by me on the following activities: development of treatment plan with patient and/or surrogate as well as nursing, discussions with consultants, evaluation of patient's response to treatment, examination of patient, obtaining history from patient or surrogate, ordering and performing treatments and interventions, ordering and review of laboratory studies, ordering and review of radiographic studies, pulse oximetry and re-evaluation of patient's condition.   Patient with weakness in the right arm and right leg with poor coordination.  Also slurred speech.  CT of the head was negative.  I spoke with neurology Dr. Selina Cooley and she agrees the patient can be admitted over [facility] to get an MRI                         Medical Decision Making Amount and/or Complexity of Data Reviewed Labs: ordered. Radiology: ordered.  Risk Prescription drug management. Decision regarding hospitalization.  Neurodeficit with possible stroke.  Patient will be admitted to [facility] and get an MRI of the head with possible neurology consult  {Document critical care time when appropriate:1} {Document review of labs and  clinical decision tools ie heart score, Chads2Vasc2 etc:1}  {Document your independent review of radiology images, and any outside records:1} {Document your discussion with family members, caretakers, and with consultants:1} {Document social determinants of health affecting pt's care:1} {Document your decision making why or why not admission, treatments were needed:1} Final Clinical Impression(s) / ED Diagnoses Final diagnoses:  Weakness    Rx / DC Orders ED Discharge Orders     None

## 2022-05-31 NOTE — ED Notes (Signed)
Pt given meal tray.

## 2022-06-01 ENCOUNTER — Inpatient Hospital Stay (HOSPITAL_COMMUNITY): Payer: Medicare Other

## 2022-06-01 ENCOUNTER — Other Ambulatory Visit (HOSPITAL_COMMUNITY): Payer: Medicare PPO

## 2022-06-01 DIAGNOSIS — I639 Cerebral infarction, unspecified: Principal | ICD-10-CM

## 2022-06-01 DIAGNOSIS — E871 Hypo-osmolality and hyponatremia: Secondary | ICD-10-CM | POA: Diagnosis not present

## 2022-06-01 DIAGNOSIS — E7800 Pure hypercholesterolemia, unspecified: Secondary | ICD-10-CM | POA: Diagnosis not present

## 2022-06-01 DIAGNOSIS — I6389 Other cerebral infarction: Secondary | ICD-10-CM | POA: Diagnosis not present

## 2022-06-01 DIAGNOSIS — R531 Weakness: Secondary | ICD-10-CM | POA: Diagnosis not present

## 2022-06-01 DIAGNOSIS — I10 Essential (primary) hypertension: Secondary | ICD-10-CM

## 2022-06-01 DIAGNOSIS — G459 Transient cerebral ischemic attack, unspecified: Secondary | ICD-10-CM | POA: Diagnosis not present

## 2022-06-01 DIAGNOSIS — D72829 Elevated white blood cell count, unspecified: Secondary | ICD-10-CM

## 2022-06-01 LAB — ECHOCARDIOGRAM COMPLETE
AR max vel: 3.14 cm2
AV Area VTI: 3.16 cm2
AV Area mean vel: 3.55 cm2
AV Mean grad: 3 mmHg
AV Peak grad: 7.6 mmHg
Ao pk vel: 1.38 m/s
Area-P 1/2: 2.82 cm2
Calc EF: 63.8 %
Height: 70 in
MV VTI: 4.04 cm2
S' Lateral: 3.2 cm
Single Plane A2C EF: 60 %
Single Plane A4C EF: 64.3 %
Weight: 2728 oz

## 2022-06-01 LAB — LIPID PANEL
Cholesterol: 169 mg/dL (ref 0–200)
HDL: 48 mg/dL (ref >40)
LDL Cholesterol: 82 mg/dL (ref 0–99)
Total CHOL/HDL Ratio: 3.5 RATIO
Triglycerides: 193 mg/dL — ABNORMAL HIGH (ref <150)
VLDL: 39 mg/dL (ref 0–40)

## 2022-06-01 LAB — TSH: TSH: 0.799 u[IU]/mL (ref 0.350–4.500)

## 2022-06-01 LAB — CBG MONITORING, ED
Glucose-Capillary: 326 mg/dL — ABNORMAL HIGH (ref 70–99)
Glucose-Capillary: 431 mg/dL — ABNORMAL HIGH (ref 70–99)

## 2022-06-01 LAB — SEDIMENTATION RATE: Sed Rate: 10 mm/hr (ref 0–16)

## 2022-06-01 LAB — C-REACTIVE PROTEIN: CRP: 0.5 mg/dL (ref <1.0)

## 2022-06-01 MED ORDER — CLOPIDOGREL BISULFATE 75 MG PO TABS
75.0000 mg | ORAL_TABLET | Freq: Every day | ORAL | Status: DC
  Administered 2022-06-01 – 2022-06-03 (×3): 75 mg via ORAL
  Filled 2022-06-01 (×3): qty 1

## 2022-06-01 MED ORDER — FENOFIBRATE 160 MG PO TABS
160.0000 mg | ORAL_TABLET | Freq: Every day | ORAL | Status: DC
  Administered 2022-06-01 – 2022-06-03 (×3): 160 mg via ORAL
  Filled 2022-06-01 (×3): qty 1

## 2022-06-01 MED ORDER — GABAPENTIN 300 MG PO CAPS
300.0000 mg | ORAL_CAPSULE | Freq: Three times a day (TID) | ORAL | Status: DC
  Administered 2022-06-01 – 2022-06-02 (×3): 300 mg via ORAL
  Filled 2022-06-01 (×4): qty 1

## 2022-06-01 MED ORDER — ALPRAZOLAM 1 MG PO TABS
1.0000 mg | ORAL_TABLET | Freq: Two times a day (BID) | ORAL | Status: DC | PRN
Start: 2022-06-01 — End: 2022-06-03

## 2022-06-01 MED ORDER — OXYCODONE-ACETAMINOPHEN 5-325 MG PO TABS
1.0000 | ORAL_TABLET | Freq: Four times a day (QID) | ORAL | Status: DC | PRN
  Administered 2022-06-01 – 2022-06-02 (×2): 1 via ORAL
  Administered 2022-06-02 – 2022-06-03 (×2): 2 via ORAL
  Filled 2022-06-01 (×2): qty 2
  Filled 2022-06-01 (×2): qty 1

## 2022-06-01 MED ORDER — ASPIRIN 81 MG PO TBEC
81.0000 mg | DELAYED_RELEASE_TABLET | Freq: Every day | ORAL | Status: DC
  Administered 2022-06-02 – 2022-06-03 (×2): 81 mg via ORAL
  Filled 2022-06-01 (×2): qty 1

## 2022-06-01 MED ORDER — OXYCODONE-ACETAMINOPHEN 5-325 MG PO TABS: 1.0000 | ORAL_TABLET | Freq: Three times a day (TID) | ORAL | Status: DC | PRN

## 2022-06-01 MED ORDER — LAMOTRIGINE 100 MG PO TABS
100.0000 mg | ORAL_TABLET | Freq: Two times a day (BID) | ORAL | Status: DC
  Administered 2022-06-01 – 2022-06-03 (×4): 100 mg via ORAL
  Filled 2022-06-01: qty 4
  Filled 2022-06-01: qty 1
  Filled 2022-06-01: qty 4
  Filled 2022-06-01: qty 1

## 2022-06-01 MED ORDER — AMPHETAMINE-DEXTROAMPHETAMINE 10 MG PO TABS
20.0000 mg | ORAL_TABLET | Freq: Two times a day (BID) | ORAL | Status: DC
  Administered 2022-06-01 – 2022-06-03 (×4): 20 mg via ORAL
  Filled 2022-06-01 (×5): qty 2

## 2022-06-01 MED ORDER — CITALOPRAM HYDROBROMIDE 20 MG PO TABS
40.0000 mg | ORAL_TABLET | Freq: Every day | ORAL | Status: DC
  Administered 2022-06-01 – 2022-06-03 (×3): 40 mg via ORAL
  Filled 2022-06-01 (×3): qty 2

## 2022-06-01 MED ORDER — SODIUM CHLORIDE 0.9 % IV BOLUS
1000.0000 mL | Freq: Once | INTRAVENOUS | Status: AC
  Administered 2022-06-01: 1000 mL via INTRAVENOUS

## 2022-06-01 NOTE — Progress Notes (Signed)
PROGRESS NOTE    Patient: Joseph Shepard                            PCP: Laurence Compton, MD (Inactive)                    DOB: 1966-01-20            DOA: 05/31/2022 QQP:[id number]             DOS: 06/01/2022, 11:09 AM   LOS: 1 day   Date of Service: The patient was seen and examined on 06/01/2022  Subjective:   The patient was seen and examined this morning. Hemodynamically stable, complaining of left arm, leg weakness, speech mumbled, some aphasia noted Denies any chest pain or shortness of breath, denies any headaches or visual changes or double vision. -No progression of symptoms.  Brief Narrative:   Joseph Shepard is a 56 y.o. male with medical history significant of diabetes mellitus type 2 on metformin, depression, hyperlipidemia, GERD, neuropathic pain, hypertension, and vitamin D deficiency as well as anxiety who presented to the emergency department with a complaint of weakness since Tuesday.   Per the patient's girlfriend he has been very weak for several days and on Tuesday they noticed the weakness.  He has difficulty moving his right leg his right great toe and his right upper extremity.  He is very weak on that side.  Prior to Tuesday he was able to walk normally and use his right upper extremity normally.  He is right-handed.  According to his fiance he has been without insulin for over 6 months and has lost 100 pounds but is very thirsty.  Patient reports that he is short of breath and that he has pain all over.   ED Course:  Sodium low at 131, glucose elevated at 415, creatinine 0.57, anion gap 9, GFR greater than 60, white blood cell count 15.2.  He is negative for COVID-19.  Glucose in urine is greater than 500 specific gravity is elevated.  CT scan of the head shows no acute intracranial findings seen on noncontrast CT scan of the brain.   Case was discussed with neurology who recommended admission to [facility] as MRI is not available at [facility] on the  weekend.  Neurology would like to be consulted if MRI is abnormal and indicative of stroke.   Assessment/Plan Principal Problem:   Stroke [facility]) Active Problems:   Uncontrolled type 2 diabetes mellitus with hyperglycemia, without long-term current use of insulin (HCC)   Hyponatremia   HTN (hypertension), benign   Hypercholesterolemia   Leukocytosis   Tobacco abuse   Stroke  -Symptomes became evident on Tuesday this past week.  Patient did not wish to seek any medical help.  Finally  as he was unable to ambulate he agreed to come in. -Initial CT scan did not reveal any intracranial abnormalities.   - Neurology was consulted who recommended transfer to [facility] for MRI/ MRI And MRI  -Neurology need to be called upon arrival to [facility]   -Continue aspirin, statins, -We will start aspirin 300 PR 325 p.o.  -PT/OT/speech consult for evaluation recommendations -2D echocardiogram -Neurochecks Q 4 hrs  -LDL: 82 /triglyceride 193 -A1c: 14.3 -TSH: - B12:  Given finding consistent with a acute dense stroke, work-up needed, inpatient neurology soon as possible recommended.   Uncontrolled DM 2 with hyperglycemia  -A1c 14.3 -Checking CBG q. ACHS,  SSI coverage -Apparently patient has been out of medicine insulin for past 6 months -Restarting long-acting insulin    Hyponatremia:  -Pseudohyponatremia likely due to hypoglycemia -Continue IV hydration, correcting blood sugars with insulin -Sodium 131,   Hypertension:  -Allowing for permissive hypertension -But holding BP meds  Hypercholesterolemia:  Lipid Panel     Component Value Date/Time   CHOL 169 06/01/2022 0604   TRIG 193 (H) 06/01/2022 0604   HDL 48 06/01/2022 0604   CHOLHDL 3.5 06/01/2022 0604   VLDL 39 06/01/2022 0604   LDLCALC 82 06/01/2022 0604   - Restart his Lipitor but increase his dose to 40 mg daily as he was previously on 10mg  daily   Leukocytosis:  -WBC at 15.2, no signs of infection, UA clean  -Possibly  reactive, monitoring closely  Tobacco abuse: - We will start nicotine patch.  Patient was counseled regarding tobacco cessation.       ----------------------------------------------------------------------------------------------------------------------------------------------- Nutritional status:  The patient's BMI is: Body mass index is 24.46 kg/m. I agree with the assessment and plan as outlined -------------------------------------------------------------------------------------------------------------------------------------------  DVT prophylaxis:  heparin injection 5,000 Units Start: 05/31/22 2200   Code Status:   Code Status: Full Code  Family Communication: No family member present at bedside- attempt will be made to update daily The above findings and plan of care has been discussed with patient (and family)  in detail,  they expressed understanding and agreement of above. -Advance care planning has been discussed.   Admission status:   Status is: Inpatient Remains inpatient appropriate because: Needing stroke work-up.     Procedures:   No admission procedures for hospital encounter.   Antimicrobials:  Anti-infectives (From admission, onward)    None        Medication:   aspirin  300 mg Rectal Daily   Or   aspirin  325 mg Oral Daily   atorvastatin  40 mg Oral q1800   heparin  5,000 Units Subcutaneous Q8H   insulin glargine-yfgn  20 Units Subcutaneous QHS    acetaminophen **OR** acetaminophen (TYLENOL) oral liquid 160 mg/5 mL **OR** acetaminophen, senna-docusate   Objective:   Vitals:   06/01/22 0700 06/01/22 0730 06/01/22 0930 06/01/22 0946  BP: (!) 149/95 (!) 158/103 (!) 139/97   Pulse: 72 64 70   Resp: 16 11 (!) 21   Temp:    97.9 F (36.6 C)  TempSrc:      SpO2: 100% 98% 99%   Weight:      Height:        Intake/Output Summary (Last 24 hours) at 06/01/2022 1109 Last data filed at 06/01/2022 0405 Gross per 24 hour  Intake 999 ml   Output 1800 ml  Net -801 ml   Filed Weights   05/31/22 1406  Weight: 77.3 kg     Examination:   Physical Exam  Constitution: Aphasia noted, slurred speech  alert, cooperative, no distress,  Appears calm and comfortable  Psychiatric:   Normal and stable mood and affect, cognition intact,   HEENT:        Normocephalic, PERRL, otherwise with in Normal limits  Chest:         Chest symmetric Cardio vascular:  S1/S2, RRR, No murmure, No Rubs or Gallops  pulmonary: Clear to auscultation bilaterally, respirations unlabored, negative wheezes / crackles Abdomen: Soft, non-tender, non-distended, bowel sounds,no masses, no organomegaly Muscular skeletal: Limited exam - in bed, able to move all 4 extremities,   Neuro: Strength left upper and lower extremity 3/5, right upper  lower extremity intact 5/5  CNII-XII intact. , normal sensation, reflexes intact  Extremities: No pitting edema lower extremities, +2 pulses  Skin: Dry, warm to touch, negative for any Rashes, No open wounds Wounds: per nursing documentation   ------------------------------------------------------------------------------------------------------------------------------------------    LABs:     Latest Ref Rng & Units 05/31/2022    2:31 PM 04/10/2016   10:51 AM 08/19/2012    9:08 AM  CBC  WBC 4.0 - 10.5 K/uL 15.2  10.6    Hemoglobin 13.0 - 17.0 g/dL 71.6  96.7  89.3   Hematocrit 39.0 - 52.0 % 42.6  40.8  40.0   Platelets 150 - 400 K/uL 308  223        Latest Ref Rng & Units 05/31/2022    2:31 PM 04/10/2016   10:51 AM 08/19/2012    9:08 AM  CMP  Glucose 70 - 99 mg/dL 810  175  102   BUN 6 - 20 mg/dL 11  17  17    Creatinine 0.61 - 1.24 mg/dL  5.85  2.77   Sodium 135 - 145 mmol/L 131  137  142   Potassium 3.5 - 5.1 mmol/L 4.0  3.5  3.4   Chloride 98 - 111 mmol/L 98  105  108   CO2 22 - 32 mmol/L 24  24    Calcium 8.9 - 10.3 mg/dL 9.2  9.7    Total Protein 6.5 - 8.1 g/dL  7.0    Total Bilirubin 0.3 - 1.2  mg/dL  0.5    Alkaline Phos 38 - 126 U/L  60    AST 15 - 41 U/L  19    ALT 17 - 63 U/L  23         Micro Results Recent Results (from the past 240 hour(s))  SARS Coronavirus 2 by RT PCR (hospital order, performed in [facility] hospital lab) *cepheid single result test* Anterior Nasal Swab     Status: None   Collection Time: 05/31/22  2:41 PM   Specimen: Anterior Nasal Swab  Result Value Ref Range Status   SARS Coronavirus 2 by RT PCR NEGATIVE NEGATIVE Final    Comment: (NOTE) SARS-CoV-2 target nucleic acids are NOT DETECTED.  The SARS-CoV-2 RNA is generally detectable in upper and lower respiratory specimens during the acute phase of infection. The lowest concentration of SARS-CoV-2 viral copies this assay can detect is 250 copies / mL. A negative result does not preclude SARS-CoV-2 infection and should not be used as the sole basis for treatment or other patient management decisions.  A negative result may occur with improper specimen collection / handling, submission of specimen other than nasopharyngeal swab, presence of viral mutation(s) within the areas targeted by this assay, and inadequate number of viral copies (<250 copies / mL). A negative result must be combined with clinical observations, patient history, and epidemiological information.  Fact Sheet for Patients:   06/02/22  Fact Sheet for Healthcare Providers: RoadLapTop.co.za  This test is not yet approved or  cleared by the http://kim-miller.com/ FDA and has been authorized for detection and/or diagnosis of SARS-CoV-2 by FDA under an Emergency Use Authorization (EUA).  This EUA will remain in effect (meaning this test can be used) for the duration of the COVID-19 declaration under Section 564(b)(1) of the Act, 21 U.S.C. section 360bbb-3(b)(1), unless the authorization is terminated or revoked sooner.  Performed at [facility], [address],  [place], [place]     Radiology Reports  CT Head Wo Contrast  Result Date: 05/31/2022 CLINICAL DATA:  Neurological deficit, weakness EXAM: CT HEAD WITHOUT CONTRAST TECHNIQUE: Contiguous axial images were obtained from the base of the skull through the vertex without intravenous contrast. RADIATION DOSE REDUCTION: This exam was performed according to the departmental dose-optimization program which includes automated exposure control, adjustment of the mA and/or kV according to patient size and/or use of iterative reconstruction technique. COMPARISON:  None Available. FINDINGS: Brain: No acute intracranial findings are seen. There are no signs of bleeding within the cranium. Ventricles are nondilated. Cortical sulci are prominent. Vascular: Unremarkable. Skull: Unremarkable. Sinuses/Orbits: Unremarkable. Other: None IMPRESSION: No acute intracranial findings are seen in noncontrast CT brain. Electronically Signed   By: Ernie Avena M.D.   On: 05/31/2022 15:47    SIGNED: Kendell Bane, MD, FHM. [facility],  Pager (please use [email or web address] to page/text) Please use Epic Secure Chat for non-urgent communication (7AM-7PM)  If 7PM-7AM, please contact night-coverage [email or web address], 06/01/2022, 11:09 AM

## 2022-06-01 NOTE — Progress Notes (Signed)
*  PRELIMINARY RESULTS* Echocardiogram 2D Echocardiogram has been performed.  Joseph Shepard 06/01/2022, 11:55 AM

## 2022-06-01 NOTE — Hospital Course (Addendum)
Joseph NIEBLAS is a 56 y.o. male with medical history significant of diabetes mellitus type 2 on metformin, depression, hyperlipidemia, GERD, neuropathic pain, hypertension, and vitamin D deficiency as well as anxiety who presented to the emergency department with a complaint of weakness since Tuesday.   Per the patient's girlfriend he has been very weak for several days and on Tuesday they noticed the weakness.  He has difficulty moving his right leg his right great toe and his right upper extremity.  He is very weak on that side.  Prior to Tuesday he was able to walk normally and use his right upper extremity normally.  He is right-handed.  According to his fiance he has been without insulin for over 6 months and has lost 100 pounds but is very thirsty.  Patient reports that he is short of breath and that he has pain all over.   ED Course:  Sodium low at 131, glucose elevated at 415, creatinine 0.57, anion gap 9, GFR greater than 60, white blood cell count 15.2.  He is negative for COVID-19.  Glucose in urine is greater than 500 specific gravity is elevated.  CT scan of the head shows no acute intracranial findings seen on noncontrast CT scan of the brain.   Case was discussed with neurology who recommended admission to [facility] as MRI is not available at [facility] on the weekend.  Neurology would like to be consulted if MRI is abnormal and indicative of stroke.   Assessment/Plan Principal Problem:   Stroke [facility]) Active Problems:   Uncontrolled type 2 diabetes mellitus with hyperglycemia, without long-term current use of insulin (HCC)   Hyponatremia   HTN (hypertension), benign   Hypercholesterolemia   Leukocytosis   Tobacco abuse   Stroke symptoms -Symptomes became evident on Tuesday this past week.  Patient did not wish to seek any medical help.  Finally  as he was unable to ambulate he agreed to come in. -Initial CT scan did not reveal any intracranial abnormalities.   -MRI/MRA-  completed revealing no acute intracranial abnormalities -Bilateral carotid studies report negative for any carotid stenosis  -Patient still symptomatic although symptoms of speech and right-sided weakness and paresthesia has improved  -Discussed the case with teleneurology Dr. Melynda Ripple who recommending:  Aspirin 81 mg daily, Atorvastatin 40 mg daily and Plavix 75 mg daily for 3 weeks    -Recommended cervical spine MRI C-spine>>> pending (History of baseline radiculopathy)   -PT/OT/speech consult for evaluation recommendations -2D echocardiogram: Ejection fraction 60-65%, moderate LVH -Neurochecks Q 4 hrs  -LDL: 82 /triglyceride 193 -A1c: 14.3 -TSH: 0.799  -CRP 0.5 -SCr 10  Given finding consistent with a acute dense stroke, work-up needed, inpatient neurology soon as possible recommended.   Uncontrolled DM 2 with hyperglycemia  -A1c 14.3 -Checking CBG q. ACHS, SSI coverage -Apparently patient has been out of medicine insulin for past 6 months -Restarting long-acting insulin    Hyponatremia:  -Pseudohyponatremia likely due to hypoglycemia -Continue IV hydration, correcting blood sugars with insulin -Sodium 131,   Hypertension:  -Allowing for permissive hypertension -But holding BP meds  Hypercholesterolemia:  Lipid Panel     Component Value Date/Time   CHOL 169 06/01/2022 0604   TRIG 193 (H) 06/01/2022 0604   HDL 48 06/01/2022 0604   CHOLHDL 3.5 06/01/2022 0604   VLDL 39 06/01/2022 0604   LDLCALC 82 06/01/2022 0604   - Restart his Lipitor but increase his dose to 40 mg daily as he was previously on 10mg   daily   Leukocytosis:  -WBC at 15.2, no signs of infection, UA clean  -Possibly reactive, monitoring closely  Tobacco abuse: - We will start nicotine patch.  Patient was counseled regarding tobacco cessation.

## 2022-06-01 NOTE — TOC Progression Note (Signed)
  Transition of Care [facility]) Screening Note   Patient Details  Name: Joseph Shepard Date of Birth: 22-Apr-1966   Transition of Care [facility]) CM/SW Contact:    Leitha Bleak, RN Phone Number: 06/01/2022, 1:06 PM    Transition of Care Department [facility]) has reviewed patient and no TOC needs have been identified at this time. We will continue to monitor patient advancement through interdisciplinary progression rounds. If new patient transition needs arise, please place a TOC consult.          Expected Discharge Plan and Services         Living arrangements for the past 2 months: Single Family Home

## 2022-06-02 ENCOUNTER — Inpatient Hospital Stay (HOSPITAL_COMMUNITY)
Admit: 2022-06-02 | Discharge: 2022-06-02 | Disposition: A | Discharge status: Home / Self Care | Payer: Medicare Other | Attending: Neurology | Admitting: Neurology

## 2022-06-02 ENCOUNTER — Inpatient Hospital Stay (HOSPITAL_COMMUNITY): Payer: Medicare Other

## 2022-06-02 ENCOUNTER — Other Ambulatory Visit: Payer: Self-pay

## 2022-06-02 ENCOUNTER — Other Ambulatory Visit (HOSPITAL_COMMUNITY): Payer: Self-pay

## 2022-06-02 ENCOUNTER — Telehealth (HOSPITAL_COMMUNITY): Payer: Self-pay | Admitting: Pharmacy Technician

## 2022-06-02 DIAGNOSIS — Z720 Tobacco use: Secondary | ICD-10-CM

## 2022-06-02 DIAGNOSIS — R2990 Unspecified symptoms and signs involving the nervous system: Secondary | ICD-10-CM

## 2022-06-02 DIAGNOSIS — G8191 Hemiplegia, unspecified affecting right dominant side: Secondary | ICD-10-CM | POA: Diagnosis not present

## 2022-06-02 DIAGNOSIS — I10 Essential (primary) hypertension: Secondary | ICD-10-CM | POA: Diagnosis not present

## 2022-06-02 DIAGNOSIS — I639 Cerebral infarction, unspecified: Secondary | ICD-10-CM | POA: Diagnosis not present

## 2022-06-02 DIAGNOSIS — R531 Weakness: Secondary | ICD-10-CM | POA: Diagnosis not present

## 2022-06-02 DIAGNOSIS — G459 Transient cerebral ischemic attack, unspecified: Secondary | ICD-10-CM | POA: Diagnosis not present

## 2022-06-02 DIAGNOSIS — E1165 Type 2 diabetes mellitus with hyperglycemia: Secondary | ICD-10-CM

## 2022-06-02 DIAGNOSIS — E7800 Pure hypercholesterolemia, unspecified: Secondary | ICD-10-CM | POA: Diagnosis not present

## 2022-06-02 DIAGNOSIS — E871 Hypo-osmolality and hyponatremia: Secondary | ICD-10-CM | POA: Diagnosis not present

## 2022-06-02 LAB — GLUCOSE, CAPILLARY
Glucose-Capillary: 159 mg/dL — ABNORMAL HIGH (ref 70–99)
Glucose-Capillary: 304 mg/dL — ABNORMAL HIGH (ref 70–99)

## 2022-06-02 LAB — CBG MONITORING, ED
Glucose-Capillary: 194 mg/dL — ABNORMAL HIGH (ref 70–99)
Glucose-Capillary: 420 mg/dL — ABNORMAL HIGH (ref 70–99)
Glucose-Capillary: 412 mg/dL — ABNORMAL HIGH (ref 70–99)

## 2022-06-02 MED ORDER — INSULIN GLARGINE-YFGN 100 UNIT/ML ~~LOC~~ SOLN
30.0000 [IU] | Freq: Every day | SUBCUTANEOUS | Status: DC
  Filled 2022-06-02: qty 0.3

## 2022-06-02 MED ORDER — INSULIN ASPART 100 UNIT/ML IJ SOLN
0.0000 [IU] | Freq: Three times a day (TID) | INTRAMUSCULAR | Status: DC
  Administered 2022-06-02 – 2022-06-03 (×2): 3 [IU] via SUBCUTANEOUS
  Administered 2022-06-03: 8 [IU] via SUBCUTANEOUS

## 2022-06-02 MED ORDER — INSULIN ASPART 100 UNIT/ML IJ SOLN
20.0000 [IU] | Freq: Once | INTRAMUSCULAR | Status: AC
  Administered 2022-06-02: 20 [IU] via SUBCUTANEOUS
  Filled 2022-06-02: qty 1

## 2022-06-02 MED ORDER — INSULIN GLARGINE-YFGN 100 UNIT/ML ~~LOC~~ SOLN
35.0000 [IU] | Freq: Every day | SUBCUTANEOUS | Status: DC
  Administered 2022-06-02: 35 [IU] via SUBCUTANEOUS
  Filled 2022-06-02 (×2): qty 0.35

## 2022-06-02 NOTE — Procedures (Signed)
Patient Name: Joseph Shepard  MRN: [id number]  Epilepsy Attending: Charlsie Quest  Referring Physician/Provider: Charlsie Quest, MD Date: 06/02/2022 Duration: 22.05 mins  Patient history: 56 year old male with uncontrolled diabetes and prior cervical fusion presented with right sided weakness and paresthesias worsened from baseline.  EEG to evaluate for seizure.  Level of alertness: Awake, asleep  AEDs during EEG study: None  Technical aspects: This EEG study was done with scalp electrodes positioned according to the 10-20 International system of electrode placement. Electrical activity was reviewed with band pass filter of 1-70Hz , sensitivity of 7 uV/mm, display speed of 49mm/sec with a 60Hz  notched filter applied as appropriate. EEG data were recorded continuously and digitally stored.  Video monitoring was available and reviewed as appropriate.  Description: The posterior dominant rhythm consists of 9 Hz activity of moderate voltage (25-35 uV) seen predominantly in posterior head regions, symmetric and reactive to eye opening and eye closing. Sleep was characterized by sleep spindles (12 to 14 Hz), maximal frontocentral region. Hyperventilation and photic stimulation were not performed.     IMPRESSION: This study is within normal limits. No seizures or epileptiform discharges were seen throughout the recording.  A normal interictal EEG does not exclude nor support the diagnosis of epilepsy.   Levern Pitter 

## 2022-06-02 NOTE — ED Notes (Signed)
Patient in room at this time

## 2022-06-02 NOTE — Evaluation (Signed)
Physical Therapy Evaluation Patient Details Name: Joseph Shepard MRN: [id number] DOB: Aug 13, 1966 Today's Date: 06/02/2022  History of Present Illness  Joseph Shepard is a 56 y.o. male with medical history significant of diabetes mellitus type 2 on metformin, depression, hyperlipidemia, GERD, neuropathic pain, hypertension, and vitamin D deficiency as well as anxiety who presented to the emergency department with a complaint of weakness since Tuesday.  Per the patient's girlfriend he has been very weak for several days and on Tuesday they noticed the weakness.  He has difficulty moving his right leg his right great toe and his right upper extremity.  He is very weak on that side.  Prior to Tuesday he was able to walk normally and use his right upper extremity normally.  He is right-handed.  According to his fiance he has been without insulin for over 6 months and has lost 100 pounds but is very thirsty.  Patient reports that he is short of breath and that he has pain all over.   Clinical Impression  Patient demonstrates good return for bed mobility when sitting up at bedside, very unsteady on feet due to right sided weakness with near loss of balance, required use of RW for safety and demonstrates slow labored cadence with decreased RLE coordination and strength and limited mostly due to fatigue.  Patient tolerated sitting up at bedside to eat breakfast after therapy - RN notified.  Patient will benefit from continued skilled physical therapy in hospital and recommended venue below to increase strength, balance, endurance for safe ADLs and gait.      Recommendations for follow up therapy are one component of a multi-disciplinary discharge planning process, led by the attending physician.  Recommendations may be updated based on patient status, additional functional criteria and insurance authorization.  Follow Up Recommendations Acute inpatient rehab (3hours/day)      Assistance Recommended  at Discharge Set up Supervision/Assistance  Patient can return home with the following  A lot of help with walking and/or transfers;A little help with bathing/dressing/bathroom;Assistance with cooking/housework;Assistance with feeding;Help with stairs or ramp for entrance    Equipment Recommendations Rolling walker (2 wheels)  Recommendations for Other Services       Functional Status Assessment Patient has had a recent decline in their functional status and demonstrates the ability to make significant improvements in function in a reasonable and predictable amount of time.     Precautions / Restrictions Precautions Precautions: Fall Restrictions Weight Bearing Restrictions: No      Mobility  Bed Mobility Overal bed mobility: Modified Independent             General bed mobility comments: slightly labored    Transfers Overall transfer level: Needs assistance Equipment used: 1 person hand held assist, Rolling walker (2 wheels) Transfers: Sit to/from Stand, Bed to chair/wheelchair/BSC Sit to Stand: Min assist   Step pivot transfers: Min assist       General transfer comment: very unsteady with near loss of balance without AD, required use of RW for safety    Ambulation/Gait Ambulation/Gait assistance: Min assist Gait Distance (Feet): 40 Feet Assistive device: Rolling walker (2 wheels) Gait Pattern/deviations: Decreased step length - right, Decreased stance time - right, Decreased stride length, Decreased weight shift to right, Decreased dorsiflexion - right Gait velocity: decreased     General Gait Details: had to use RW, slow labored cadence with decreased ankle dorsiflexion right ankle, decreased coordination of right side  Stairs  Wheelchair Mobility    Modified Rankin (Stroke Patients Only)       Balance Overall balance assessment: Needs assistance Sitting-balance support: Feet supported, No upper extremity supported Sitting  balance-Leahy Scale: Good Sitting balance - Comments: seated at EOB   Standing balance support: During functional activity, No upper extremity supported Standing balance-Leahy Scale: Poor Standing balance comment: fair using RW                             Pertinent Vitals/Pain Pain Assessment Pain Assessment: 0-10 Pain Score: 8  Pain Location: low back and neck Pain Descriptors / Indicators: Aching, Sore Pain Intervention(s): Limited activity within patient's tolerance, Monitored during session, Repositioned    Home Living Family/patient expects to be discharged to:: Private residence Living Arrangements: Spouse/significant other;Non-relatives/Friends Available Help at Discharge: Family;Available 24 hours/day Type of Home: Mobile home Home Access: Ramped entrance       Home Layout: One level Home Equipment: Cane - single point      Prior Function Prior Level of Function : Independent/Modified Independent             Mobility Comments: household and short distanced community ambulator using [facility] ADLs Comments: Independent, does not drive     Hand Dominance   Dominant Hand: Right    Extremity/Trunk Assessment   Upper Extremity Assessment Upper Extremity Assessment: Defer to OT evaluation    Lower Extremity Assessment Lower Extremity Assessment: RLE deficits/detail;LLE deficits/detail RLE Deficits / Details: grossly 3+/5 RLE Sensation: decreased light touch;decreased proprioception RLE Coordination: decreased gross motor LLE Deficits / Details: grossly WFL 5/5 LLE Sensation: WNL LLE Coordination: WNL       Communication   Communication: No difficulties  Cognition Arousal/Alertness: Awake/alert Behavior During Therapy: WFL for tasks assessed/performed Overall Cognitive Status: Within Functional Limits for tasks assessed                                          General Comments      Exercises     Assessment/Plan    PT  Assessment Patient needs continued PT services  PT Problem List Decreased strength;Decreased activity tolerance;Decreased balance;Decreased mobility       PT Treatment Interventions DME instruction;Gait training;Stair training;Functional mobility training;Therapeutic activities;Therapeutic exercise;Balance training;Patient/family education    PT Goals (Current goals can be found in the Care Plan section)  Acute Rehab PT Goals Patient Stated Goal: return home after rehab PT Goal Formulation: With patient Time For Goal Achievement: 06/16/22 Potential to Achieve Goals: Good    Frequency Min 5X/week     Co-evaluation               AM-PAC PT "6 Clicks" Mobility  Outcome Measure Help needed turning from your back to your side while in a flat bed without using bedrails?: None Help needed moving from lying on your back to sitting on the side of a flat bed without using bedrails?: None Help needed moving to and from a bed to a chair (including a wheelchair)?: A Lot Help needed standing up from a chair using your arms (e.g., wheelchair or bedside chair)?: A Little Help needed to walk in hospital room?: A Lot Help needed climbing 3-5 steps with a railing? : A Lot 6 Click Score: 17    End of Session   Activity Tolerance: Patient tolerated treatment well;Patient limited by  fatigue Patient left: in bed;with call bell/phone within reach Nurse Communication: Mobility status PT Visit Diagnosis: Unsteadiness on feet (R26.81);Other abnormalities of gait and mobility (R26.89);Muscle weakness (generalized) (M62.81)    Time: 9147-8295 PT Time Calculation (min) (ACUTE ONLY): 28 min   Charges:   PT Evaluation $PT Eval Moderate Complexity: 1 Mod PT Treatments $Therapeutic Activity: 23-37 mins        12:40 PM, 06/02/22 Ocie Bob, MPT Physical Therapist with [facility] [phone] office [phone] mobile phone

## 2022-06-02 NOTE — Inpatient Diabetes Management (Addendum)
Inpatient Diabetes Program Recommendations  AACE/ADA: New Consensus Statement on Inpatient Glycemic Control   Target Ranges:  Prepandial:   less than 140 mg/dL      Peak postprandial:   less than 180 mg/dL (1-2 hours)      Critically ill patients:  140 - 180 mg/dL    Latest Reference Range & Units 05/31/22 14:09 05/31/22 20:04 06/01/22 16:10 06/01/22 21:29 06/02/22 09:02  Glucose-Capillary 70 - 99 mg/dL 696 (H) 295 (H) 284 (H) 431 (H) 194 (H)    Latest Reference Range & Units 05/31/22 14:31  CO2 22 - 32 mmol/L 24  Glucose 70 - 99 mg/dL 132 (H)  Anion gap 5 - 15  9    Latest Reference Range & Units 05/31/22 14:31  Hemoglobin A1C 4.8 - 5.6 % 14.3 (H)   Review of Glycemic Control  Diabetes history: DM2 Outpatient Diabetes medications: Lantus 30 units daily (none in 6 months), Metformin 1000 mg BID  Current orders for Inpatient glycemic control: Semglee 30 units QHS  Inpatient Diabetes Program Recommendations:    Insulin: Please consider ordering CBGs AC&HS with Novolog 0-15 units TID with meals and Novolog  0-5 units QHS.  HbgA1C: A1C 14.3% on 05/31/22 indicating an average glucose of 364 mg/dl over the past 2-3 months.  Outpatient: Please provide Rx for glucose test strips and lancets at discharge. Also, patient states he would be willing to resume basal insulin as long as it is affordable. Patient would prefer insulin pens and would also need insulin pen needles.  NOTE: Patient in [facility] ED with weakness on right side. Per H&P on 05/31/22, admitted with stroke with initial lab glucose of 415 mg/dl on 4/40/10. A1C 14.3% indicating an average glucose of 364 mg/dl over the past 2-3 months. H&P notes that patient ran out of insulin 6 months ago. Will plan to speak with patient today.  Addendum 06/02/22!13:58-Spoke with patient over the phone about diabetes and home regimen for diabetes control. Patient reports being followed by PCP for diabetes management and currently he is only taking  Metformin 1000 mg BID as an outpatient for diabetes control. Inquired about Lantus and patient reports that he had been on Lantus but he ran out and when he called his pharmacy to refill it last time he was told it was on back order and he just never called back about it. Patient states that the copay was expensive as well. Patient reports that he got a new insurance plan that was effective in July 2023 so he is not sure what his copay would be for the Lantus.   Patient reports checking glucose at home and notes it has been in the 300's mg/dl over the past couple of weeks.  Inquired about prior A1C and patient reports not being able to recall last A1C value. Discussed A1C results (14.3% on 05/31/22) and explained that current A1C indicates an average glucose of 364 mg/dl over the past 2-3 months. Discussed glucose and A1C goals. Discussed importance of checking CBGs and maintaining good CBG control to prevent long-term and short-term complications. Explained how hyperglycemia leads to damage within blood vessels which lead to the common complications seen with uncontrolled diabetes. Stressed to the patient the importance of improving glycemic control to prevent further complications from uncontrolled diabetes. Patient reports that he does not have any upcoming appointments with PCP already scheduled. Asked patient to call and schedule follow up with PCP. Asked patient if he would resume taking insulin if prescribed at discharge and he  states he would as long as it is affordable. Informed patient that I would ask pharmacy team to check copay for Lantus or similar basal insulin that may be preferred with current insurance. Patient states that he would prefer to use insulin pens and would also need insulin pen needles. Encouraged patient to check glucose 2-3 times per day and he states he would need more testing supplies if he were to test that often.  Patient verbalized understanding of information discussed and  reports no further questions at this time related to diabetes.  Thanks, Orlando Penner, RN, MSN, CDCES Diabetes Coordinator Inpatient Diabetes Program [phone] (Team Pager from 8am to 5pm)

## 2022-06-02 NOTE — TOC Benefit Eligibility Note (Signed)
Patient Product/process development scientist completed.    The patient is currently admitted and upon discharge could be taking Lantus Pens.  The current 30 day co-pay is $35.00.   The patient is insured through [employer] Medicare Part D     Roland Earl, CPhT Pharmacy Patient Advocate Specialist [facility] Patient Advocate Team Direct Number: [phone]  Fax: [phone]

## 2022-06-02 NOTE — Progress Notes (Signed)
PROGRESS NOTE    Patient: Joseph Shepard                            PCP: Laurence Comptonampbell, Michelle, MD (Inactive)                    DOB: Oct 02, 1966            DOA: 05/31/2022 [id number]             DOS: 06/02/2022, 11:30 AM   LOS: 2 days   Date of Service: The patient was seen and examined on 06/02/2022  Subjective:   The patient was seen and examined this morning, stable no acute distress Speech seems to be much improved, moving all 4 extremities Stating that he feels much better  Status post MRI/MRA-he tolerated well  He has been evaluated by telemetry neurology Dr. Melynda RippleYadav  Brief Narrative:   Joseph Shepard is a 56 y.o. male with medical history significant of diabetes mellitus type 2 on metformin, depression, hyperlipidemia, GERD, neuropathic pain, hypertension, and vitamin D deficiency as well as anxiety who presented to the emergency department with a complaint of weakness since Tuesday.   Per the patient's girlfriend he has been very weak for several days and on Tuesday they noticed the weakness.  He has difficulty moving his right leg his right great toe and his right upper extremity.  He is very weak on that side.  Prior to Tuesday he was able to walk normally and use his right upper extremity normally.  He is right-handed.  According to his fiance he has been without insulin for over 6 months and has lost 100 pounds but is very thirsty.  Patient reports that he is short of breath and that he has pain all over.   ED Course:  Sodium low at 131, glucose elevated at 415, creatinine 0.57, anion gap 9, GFR greater than 60, white blood cell count 15.2.  He is negative for COVID-19.  Glucose in urine is greater than 500 specific gravity is elevated.  CT scan of the head shows no acute intracranial findings seen on noncontrast CT scan of the brain.   Case was discussed with neurology who [facility] as [facility] is not available at [facility] on the weekend.   Neurology would like to be consulted if MRI is abnormal and indicative of stroke.   Assessment/Plan Principal Problem:   Stroke [facility](HCC) Active Problems:   Uncontrolled type 2 diabetes mellitus with hyperglycemia, without long-term current use of insulin (HCC)   Hyponatremia   HTN (hypertension), benign   Hypercholesterolemia   Leukocytosis   Tobacco abuse   Stroke symptoms -Symptomes became evident on Tuesday this past week.  Patient did not wish to seek any medical help.  Finally  as he was unable to ambulate he agreed to come in. -Initial CT scan did not reveal any intracranial abnormalities.   -MRI/MRA- completed revealing no acute intracranial abnormalities -Bilateral carotid studies report negative for any carotid stenosis  -Patient still symptomatic although symptoms of speech and right-sided weakness and paresthesia has improved  -Discussed the case with teleneurology Dr. Melynda RippleYadav who recommending:  Aspirin 81 mg daily, Atorvastatin 40 mg daily and Plavix 75 mg daily for 3 weeks    -Recommended cervical spine MRI C-spine>>> pending (History of baseline radiculopathy)   -PT/OT/speech consult for evaluation recommendations -2D echocardiogram: Ejection fraction 60-65%, moderate LVH -Neurochecks Q 4 hrs  -  LDL: 82 /triglyceride 193 -A1c: 14.3 -TSH: 0.799  -CRP 0.5 -SCr 10  Given finding consistent with a acute dense stroke, work-up needed, inpatient neurology soon as possible recommended.   Uncontrolled DM 2 with hyperglycemia  -A1c 14.3 -Checking CBG q. ACHS, SSI coverage -Apparently patient has been out of medicine insulin for past 6 months -Restarting long-acting insulin    Hyponatremia:  -Pseudohyponatremia likely due to hypoglycemia -Continue IV hydration, correcting blood sugars with insulin -Sodium 131,   Hypertension:  -Allowing for permissive hypertension -But holding BP meds  Hypercholesterolemia:  Lipid Panel     Component Value Date/Time   CHOL  169 06/01/2022 0604   TRIG 193 (H) 06/01/2022 0604   HDL 48 06/01/2022 0604   CHOLHDL 3.5 06/01/2022 0604   VLDL 39 06/01/2022 0604   LDLCALC 82 06/01/2022 0604   - Restart his Lipitor but increase his dose to 40 mg daily as he was previously on  daily   Leukocytosis:  -WBC at 15.2, no signs of infection, UA clean  -Possibly reactive, monitoring closely  Tobacco abuse: - We will start nicotine patch.  Patient was counseled regarding tobacco cessation.       ----------------------------------------------------------------------------------------------------------------------------------------------- Nutritional status:  The patient's BMI is: Body mass index is 24.46 kg/m. I agree with the assessment and plan as outlined -------------------------------------------------------------------------------------------------------------------------------------------  DVT prophylaxis:  heparin injection 5,000 Units Start: 05/31/22 2200   Code Status:   Code Status: Full Code  Family Communication: No family member present at bedside- attempt will be made to update daily The above findings and plan of care has been discussed with patient (and family)  in detail,  they expressed understanding and agreement of above. -Advance care planning has been discussed.   Admission status:   Status is: Inpatient Remains inpatient appropriate because: Needing stroke work-up.     Procedures:   No admission procedures for hospital encounter.   Antimicrobials:  Anti-infectives (From admission, onward)    None        Medication:   amphetamine-dextroamphetamine  20 mg Oral BID WC   aspirin EC  81 mg Oral Daily   atorvastatin  40 mg Oral q1800   citalopram  40 mg Oral Daily   clopidogrel  75 mg Oral Daily   fenofibrate  160 mg Oral Daily   gabapentin  300 mg Oral Q8H   heparin  5,000 Units Subcutaneous Q8H   insulin glargine-yfgn  30 Units Subcutaneous QHS   lamoTRIgine  100  mg Oral BID    acetaminophen **OR** acetaminophen (TYLENOL) oral liquid 160 mg/5 mL **OR** acetaminophen, ALPRAZolam, oxyCODONE-acetaminophen, senna-docusate   Objective:   Vitals:   06/02/22 0500 06/02/22 0600 06/02/22 0716 06/02/22 0958  BP: 136/82 (!) 148/94  134/88  Pulse: 64 67  75  Resp: Temp:   97.7 F (36.5 C) 97.9 F (36.6 C)  TempSrc:   Oral Oral  SpO2: 98% 98%  100%  Weight:      Height:        Intake/Output Summary (Last 24 hours) at 06/02/2022 1130 Last data filed at 06/01/2022 1231 Gross per 24 hour  Intake 999 ml  Output --  Net 999 ml   Filed Weights   05/31/22 1406  Weight: 77.3 kg     Examination:    General:  AAO x 3,  cooperative, no distress; much improved speech and aphasia  HEENT:  Normocephalic, PERRL, otherwise with in Normal limits   Neuro:  Slurred speech much  improved this morning, improved left upper lower extremity weakness  CNII-XII intact. , normal sensation, reflexes intact   Lungs:   Clear to auscultation BL, Respirations unlabored,  No wheezes / crackles  Cardio:    S1/S2, RRR, No murmure, No Rubs or Gallops   Abdomen:  Soft, non-tender, bowel sounds active all four quadrants, no guarding or peritoneal signs.  Muscular  skeletal:  Limited exam -global generalized weaknesses - in bed, able to move all 4 extremities,   2+ pulses,  symmetric, No pitting edema  Skin:  Dry, warm to touch, negative for any Rashes,  Wounds: Please see nursing documentation          ------------------------------------------------------------------------------------------------------------------------------------------    LABs:     Latest Ref Rng & Units 05/31/2022    2:31 PM 04/10/2016   10:51 AM 08/19/2012    9:08 AM  CBC  WBC 4.0 - 10.5 K/uL 15.2  10.6    Hemoglobin 13.0 - 17.0 g/dL 11.9  41.7  40.8   Hematocrit 39.0 - 52.0 % 42.6  40.8  40.0   Platelets 150 - 400 K/uL 308  223        Latest Ref Rng & Units 05/31/2022     2:31 PM 04/10/2016   10:51 AM 08/19/2012    9:08 AM  CMP  Glucose 70 - 99 mg/dL 144  818  563   BUN 6 - 20 mg/dL 11  17  17    Creatinine 0.61 - 1.24 mg/dL  1.49  7.02   Sodium 135 - 145 mmol/L 131  137  142   Potassium 3.5 - 5.1 mmol/L 4.0  3.5  3.4   Chloride 98 - 111 mmol/L 98  105  108   CO2 22 - 32 mmol/L 24  24    Calcium 8.9 - 10.3 mg/dL 9.2  9.7    Total Protein 6.5 - 8.1 g/dL  7.0    Total Bilirubin 0.3 - 1.2 mg/dL  0.5    Alkaline Phos 38 - 126 U/L  60    AST 15 - 41 U/L  19    ALT 17 - 63 U/L  23         Micro Results Recent Results (from the past 240 hour(s))  SARS Coronavirus 2 by RT PCR (hospital order, performed in [facility] hospital lab) *cepheid single result test* Anterior Nasal Swab     Status: None   Collection Time: 05/31/22  2:41 PM   Specimen: Anterior Nasal Swab  Result Value Ref Range Status   SARS Coronavirus 2 by RT PCR NEGATIVE NEGATIVE Final    Comment: (NOTE) SARS-CoV-2 target nucleic acids are NOT DETECTED.  The SARS-CoV-2 RNA is generally detectable in upper and lower respiratory specimens during the acute phase of infection. The lowest concentration of SARS-CoV-2 viral copies this assay can detect is 250 copies / mL. A negative result does not preclude SARS-CoV-2 infection and should not be used as the sole basis for treatment or other patient management decisions.  A negative result may occur with improper specimen collection / handling, submission of specimen other than nasopharyngeal swab, presence of viral mutation(s) within the areas targeted by this assay, and inadequate number of viral copies (<250 copies / mL). A negative result must be combined with clinical observations, patient history, and epidemiological information.  Fact Sheet for Patients:   06/02/22  Fact Sheet for Healthcare Providers: RoadLapTop.co.za  This test is not yet approved or  cleared by the  [place] [employer] and has been authorized for detection and/or diagnosis of SARS-CoV-2 by FDA under an TEFL teacher (EUA).  This EUA will remain in effect (meaning this test can be used) for the duration of the COVID-19 declaration under Section 564(b)(1) of the Act, 21 U.S.C. section 360bbb-3(b)(1), unless the authorization is terminated or revoked sooner.  Performed at [facility], [address] [place], [place] [address]     Radiology Reports US Carotid Bilateral  Result Date: 06/02/2022 CLINICAL DATA:  Left-sided weakness Dysarthria Aphasia Hypertension Hyperlipidemia Diabetes Tobacco use EXAM: BILATERAL CAROTID DUPLEX ULTRASOUND TECHNIQUE: Wallace Cullens scale imaging, color Doppler and duplex ultrasound were performed of bilateral carotid and vertebral arteries in the neck. COMPARISON:  None available FINDINGS: Criteria: Quantification of carotid stenosis is based on velocity parameters that correlate the residual internal carotid diameter with NASCET-based stenosis levels, using the diameter of the distal internal carotid lumen as the denominator for stenosis measurement. The following velocity measurements were obtained: RIGHT ICA: 82/32 cm/sec CCA: 117/26 cm/sec SYSTOLIC ICA/CCA RATIO:  0.7 ECA: 105 cm/sec LEFT ICA: 69/30 cm/sec CCA: 114/29 cm/sec SYSTOLIC ICA/CCA RATIO:  0.6 ECA: 95 cm/sec RIGHT CAROTID ARTERY: No significant atheromatous plaque. RIGHT VERTEBRAL ARTERY:  Antegrade flow. LEFT CAROTID ARTERY:  No significant atheromatous plaque. LEFT VERTEBRAL ARTERY:  Antegrade flow. IMPRESSION: No significant stenosis of internal carotid arteries. Electronically Signed   By: Acquanetta Belling M.D.   On: 06/02/2022 10:53   MR BRAIN WO CONTRAST  Result Date: 06/02/2022 CLINICAL DATA:  Transient ischemic attack (TIA); Stroke, follow up. EXAM: MRI HEAD WITHOUT CONTRAST MRA HEAD WITHOUT CONTRAST TECHNIQUE: Multiplanar, multi-echo pulse sequences of the brain and surrounding structures were  acquired without intravenous contrast. Angiographic images of the Circle of Willis were acquired using MRA technique without intravenous contrast. COMPARISON:  Head CT 05/31/2022 FINDINGS: MRI HEAD FINDINGS Brain: There is no evidence of an acute infarct, intracranial hemorrhage, mass, midline shift, or extra-axial fluid collection. The ventricles and sulci are within normal limits for age. No significant white matter disease is seen. Vascular: Major intracranial vascular flow voids are preserved. Skull and upper cervical spine: Unremarkable bone marrow signal. Prior anterior cervical spine fusion. Sinuses/Orbits: Unremarkable orbits. No significant inflammatory changes in the paranasal sinuses. Clear mastoid air cells. Other: None. MRA HEAD FINDINGS Anterior circulation: The internal carotid arteries are widely patent from skull base to carotid termini. ACAs and MCAs are patent without evidence of a proximal branch occlusion or significant proximal stenosis. The left A1 segment is dominant. No aneurysm is identified. Posterior circulation: The included intracranial portions of the vertebral arteries are widely patent to the basilar with the left being mildly dominant. The right PICA and left AICA appear dominant. Patent SCA origins are seen bilaterally. The basilar artery is widely patent. Both PCAs are patent without evidence of a significant proximal stenosis. No aneurysm is identified. Anatomic variants: None of significance. IMPRESSION: 1. Unremarkable appearance of the brain. 2. Negative head MRA. Electronically Signed   By: Sebastian Ache M.D.   On: 06/02/2022 09:09   MR ANGIO HEAD WO CONTRAST  Result Date: 06/02/2022 CLINICAL DATA:  Transient ischemic attack (TIA); Stroke, follow up. EXAM: MRI HEAD WITHOUT CONTRAST MRA HEAD WITHOUT CONTRAST TECHNIQUE: Multiplanar, multi-echo pulse sequences of the brain and surrounding structures were acquired without intravenous contrast. Angiographic images of the Circle  of Willis were acquired using MRA technique without intravenous contrast. COMPARISON:  Head CT 05/31/2022 FINDINGS: MRI HEAD FINDINGS Brain: There is no evidence of an  acute infarct, intracranial hemorrhage, mass, midline shift, or extra-axial fluid collection. The ventricles and sulci are within normal limits for age. No significant white matter disease is seen. Vascular: Major intracranial vascular flow voids are preserved. Skull and upper cervical spine: Unremarkable bone marrow signal. Prior anterior cervical spine fusion. Sinuses/Orbits: Unremarkable orbits. No significant inflammatory changes in the paranasal sinuses. Clear mastoid air cells. Other: None. MRA HEAD FINDINGS Anterior circulation: The internal carotid arteries are widely patent from skull base to carotid termini. ACAs and MCAs are patent without evidence of a proximal branch occlusion or significant proximal stenosis. The left A1 segment is dominant. No aneurysm is identified. Posterior circulation: The included intracranial portions of the vertebral arteries are widely patent to the basilar with the left being mildly dominant. The right PICA and left AICA appear dominant. Patent SCA origins are seen bilaterally. The basilar artery is widely patent. Both PCAs are patent without evidence of a significant proximal stenosis. No aneurysm is identified. Anatomic variants: None of significance. IMPRESSION: 1. Unremarkable appearance of the brain. 2. Negative head MRA. Electronically Signed   By: Sebastian Ache M.D.   On: 06/02/2022 09:09   ECHOCARDIOGRAM COMPLETE  Result Date: 06/01/2022    ECHOCARDIOGRAM REPORT   Patient Name:   Joseph Shepard Date of Exam: 06/01/2022 Medical Rec #:  [id number]         Height:       70.0 in Accession #:    [id number]        Weight:       170.5 lb Date of Birth:  [birth year]        BSA:          1.950 m Patient Age:    55 years          BP:           149/100 mmHg Patient Gender: M                 HR:           70  bpm. [facility]am [facility] Procedure: 2D Echo, Cardiac Doppler and Color Doppler Indications:    Stroke  History:        Patient has no prior history of Echocardiogram examinations.                 Stroke, Signs/Symptoms:Chest Pain; Risk Factors:Hypertension,                 Diabetes and Current Smoker.  Sonographer:    Mikki Harbor Referring Phys: [phone] THERESA C SHEEHAN IMPRESSIONS  1. Left ventricular ejection fraction, by estimation, is 60 to 65%. The left ventricle has normal function. The left ventricle has no regional wall motion abnormalities. There is moderate left ventricular hypertrophy. Left ventricular diastolic parameters are indeterminate.  2. Right ventricular systolic function is normal. The right ventricular size is normal.  3. Left atrial size was mildly dilated.  4. The mitral valve is normal in structure. No evidence of mitral valve regurgitation. No evidence of mitral stenosis.  5. The aortic valve is tricuspid. Aortic valve regurgitation is trivial. No aortic stenosis is present.  6. Aortic dilatation noted. 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  7. The inferior vena cava is normal in size with greater than 50% respiratory variability, suggesting right atrial pressure of 3 mmHg. FINDINGS  Left Ventricle: Left ventricular ejection fraction, by estimation,  is 60 to 65%. The left ventricle has normal function. The left ventricle has no regional wall motion abnormalities. The left ventricular internal cavity size was normal in size. There is  moderate left ventricular hypertrophy. Left ventricular diastolic parameters are indeterminate. Right Ventricle: The right ventricular size is normal. No increase in right ventricular wall thickness. Right ventricular systolic function is normal. Left Atrium: Left atrial size was mildly dilated. Right Atrium: Right atrial size was normal in size. Pericardium: Trivial  pericardial effusion is present. Mitral Valve: The mitral valve is normal in structure. No evidence of mitral valve regurgitation. No evidence of mitral valve stenosis. MV peak gradient, 1.9 mmHg. The mean mitral valve gradient is 1.0 mmHg. Tricuspid Valve: The tricuspid valve is normal in structure. Tricuspid valve regurgitation is trivial. Aortic Valve: The aortic valve is tricuspid. Aortic valve regurgitation is trivial. No aortic stenosis is present. Aortic valve mean gradient measures 3.0 mmHg. Aortic valve peak gradient measures 7.6 mmHg. Aortic valve area, by VTI measures 3.16 cm. Pulmonic Valve: The pulmonic valve was not well visualized. Pulmonic valve regurgitation is not visualized. Aorta: Aortic dilatation noted. 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 Venous: The inferior vena cava is normal in size with greater than 50% respiratory variability, suggesting right atrial pressure of 3 mmHg. IAS/Shunts: The interatrial septum was not well visualized.  LEFT VENTRICLE PLAX 2D LVIDd:         5.10 cm     Diastology LVIDs:         3.20 cm     LV e' medial:    6.85 cm/s LV PW:         1.50 cm     LV E/e' medial:  8.4 LV IVS:        1.30 cm     LV e' lateral:   10.80 cm/s LVOT diam:     2.10 cm     LV E/e' lateral: 5.3 LV SV:         82 LV SV Index:   42 LVOT Area:     3.46 cm  LV Volumes (MOD) LV vol d, MOD A2C: 94.7 ml LV vol d, MOD A4C: 94.2 ml LV vol s, MOD A2C: 37.9 ml LV vol s, MOD A4C: 33.6 ml LV SV MOD A2C:     56.8 ml LV SV MOD A4C:     94.2 ml LV SV MOD BP:      62.1 ml RIGHT VENTRICLE RV Basal diam:  3.45 cm RV Mid diam:    3.60 cm RV S prime:     15.30 cm/s TAPSE (M-mode): 2.4 cm LEFT ATRIUM              Index        RIGHT ATRIUM           Index LA diam:        4.30 cm  2.20 cm/m   RA Area:     21.20 cm LA Vol (A2C):   102.0 ml 52.30 ml/m  RA Volume:   57.00 ml  29.22 ml/m LA Vol (A4C):   49.2 ml  25.23 ml/m LA  Biplane Vol: 71.3 ml  36.56 ml/m  AORTIC VALVE                    PULMONIC VALVE AV Area (Vmax):    3.14 cm     PV Vmax:  0.91 m/s AV Area (Vmean):   3.55 cm     PV Peak grad:  3.3 mmHg AV Area (VTI):     3.16 cm AV Vmax:           138.00 cm/s AV Vmean:          77.900 cm/s AV VTI:            0.259 m AV Peak Grad:      7.6 mmHg AV Mean Grad:      3.0 mmHg LVOT Vmax:         125.00 cm/s LVOT Vmean:        79.900 cm/s LVOT VTI:          0.236 m LVOT/AV VTI ratio: 0.91  AORTA Ao Root diam: 4.20 cm Ao Asc diam:  4.10 cm MITRAL VALVE MV Area (PHT): 2.82 cm    SHUNTS MV Area VTI:   4.04 cm    Systemic VTI:  0.24 m MV Peak grad:  1.9 mmHg    Systemic Diam: 2.10 cm MV Mean grad:  1.0 mmHg MV Vmax:       0.69 m/s MV Vmean:      39.7 cm/s MV Decel Time: 269 msec MV E velocity: 57.40 cm/s MV A velocity: 59.60 cm/s MV E/A ratio:  0.96 Epifanio Lesches MD Electronically signed by Epifanio Lesches MD Signature Date/Time: 06/01/2022/2:31:34 PM    Final     SIGNED: Kendell Bane, MD, FHM. [facility],  Pager (please use [email or web address] to page/text) Please use Epic Secure Chat for non-urgent communication (7AM-7PM)  If 7PM-7AM, please contact night-coverage [email or web address], 06/02/2022, 11:30 AM

## 2022-06-02 NOTE — Telephone Encounter (Signed)
Pharmacy Patient Advocate Encounter  Insurance verification completed.    The patient is insured through [employer] Part D   The patient is currently admitted and ran test claims for the following: Lantus.  Copays and coinsurance results were relayed to Inpatient clinical team.
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
## 2022-06-02 NOTE — Progress Notes (Signed)
SLP Cancellation Note  Patient Details Name: Joseph Shepard MRN: [id number] DOB: 1965-12-09   Cancelled treatment:       Reason Eval/Treat Not Completed: Other (comment); SLP spoke with Pt and significant other while Pt was in the ED. Pt indicates that his speech is not yet back to baseline. He speaks with some hesitations and reduced fluency, but states that it is improving. He is on disability due to poor vision and poor memory (per Pt). His fiancee assists him at home and helps remind him to take medications. Will defer SLE until tomorrow. MRI negative for acute changes, however Pt with right sided weakness.  Thank you,  [name], CCC-SLP [phone]    [name] 06/02/2022, 4:02 PM

## 2022-06-02 NOTE — Progress Notes (Signed)
EEG complete - results pending 

## 2022-06-02 NOTE — ED Notes (Signed)
Patient transported to MRI 

## 2022-06-02 NOTE — Plan of Care (Signed)
  Problem: Acute Rehab PT Goals(only PT should resolve) Goal: Pt Will Go Supine/Side To Sit Outcome: Progressing Flowsheets (Taken 06/02/2022 1241) Pt will go Supine/Side to Sit:  Independently  with modified independence Goal: Patient Will Transfer Sit To/From Stand Outcome: Progressing Flowsheets (Taken 06/02/2022 1241) Patient will transfer sit to/from stand: with min guard assist Goal: Pt Will Transfer Bed To Chair/Chair To Bed Outcome: Progressing Flowsheets (Taken 06/02/2022 1241) Pt will Transfer Bed to Chair/Chair to Bed: min guard assist Goal: Pt Will Ambulate Outcome: Progressing Flowsheets (Taken 06/02/2022 1241) Pt will Ambulate:  75 feet  with min guard assist  with minimal assist  with rolling walker  with cane   12:42 PM, 06/02/22 Ocie Bob, MPT Physical Therapist with [facility] [phone] office [phone] mobile phone

## 2022-06-02 NOTE — TOC Initial Note (Signed)
Transition of Care [facility]) - Initial/Assessment Note    Patient Details  Name: Joseph Shepard MRN: [id number] Date of Birth: [birth year]  Transition of Care [facility]) CM/SW Contact:    Elliot Gault, LCSW Phone Number: 06/02/2022, 2:33 PM  Clinical Narrative:                  Pt from home. PT had recommended CIR for pt though the eval from CIR stated pt does not meet criteria. Spoke with pt and offered alternative rehab option at SNF. Pt agreeable, he states he lives near [place]. CMS provider options reviewed. Will refer as requested.   TOC will follow.  Expected Discharge Plan: Skilled Nursing Facility Barriers to Discharge: Continued Medical Work up   Patient Goals and CMS Choice Patient states their goals for this hospitalization and ongoing recovery are:: get better CMS Medicare.gov Compare Post Acute Care list provided to:: Patient Choice offered to / list presented to : Patient  Expected Discharge Plan and Services Expected Discharge Plan: Skilled Nursing Facility In-house Referral: Clinical Social Work   Post Acute Care Choice: Skilled Nursing Facility Living arrangements for the past 2 months: Single Family Home                                      Prior Living Arrangements/Services Living arrangements for the past 2 months: Single Family Home Lives with:: Spouse Patient language and need for interpreter reviewed:: Yes Do you feel safe going back to the place where you live?: Yes      Need for Family Participation in Patient Care: No (Comment)     Criminal Activity/Legal Involvement Pertinent to Current Situation/Hospitalization: No - Comment as needed  Activities of Daily Living      Permission Sought/Granted Permission sought to share information with : Facility Industrial/product designer granted to share information with : Yes, Verbal Permission Granted     Permission granted to share info w AGENCY: SNFs        Emotional Assessment    Attitude/Demeanor/Rapport: Engaged Affect (typically observed): Pleasant Orientation: : Oriented to Self, Oriented to Place, Oriented to  Time, Oriented to Situation Alcohol / Substance Use: Not Applicable Psych Involvement: No (comment)  Admission diagnosis:  Stroke [facility]) [I63.9] Patient Active Problem List   Diagnosis Date Noted   Stroke (HCC) 05/31/2022   Uncontrolled type 2 diabetes mellitus with hyperglycemia, without long-term current use of insulin (HCC) 05/31/2022   Hyponatremia 05/31/2022   Tobacco abuse 05/31/2022   Leukocytosis 05/31/2022   Radiculopathy 04/17/2016   Chest pain, atypical 07/31/2011   Hypercholesterolemia 07/31/2011   HTN (hypertension), benign 07/31/2011   PCP:  Laurence Compton, MD (Inactive) Pharmacy:   [facility] [place], [place] - [address] [place] [address] [place] [address] [place] [address] Phone: [phone] Fax: [phone]  [id number] - [place], [place] - [address] [place] [address] Phone: [phone] Fax: [phone]  [id number] [place], [place] - [address] [facility] [address] [place] [address] Phone: [phone] Fax: [phone]     Social Determinants of Health (SDOH) Interventions    Readmission Risk Interventions     No data to display

## 2022-06-02 NOTE — Progress Notes (Signed)
Inpatient Rehab Admissions Coordinator:   Per therapy recommendations, patient was screened for CIR candidacy by Himmat Enberg, MS, CCC-SLP. At this time, Pt. does not appear to demonstrate medical necessity to justify in hospital rehabilitation/CIR. will not pursue a rehab consult for this Pt.   Recommend other rehab venues to be pursued.  Please contact me with any questions.  Maya Scholer, MS, CCC-SLP Rehab Admissions Coordinator  [phone] (celll) [phone] (office)   

## 2022-06-02 NOTE — TOC Progression Note (Signed)
  Transition of Care [facility]) Screening Note   Patient Details  Name: Joseph Shepard Date of Birth: May 14, 1966   Transition of Care [facility]) CM/SW Contact:    Elliot Gault, LCSW Phone Number: 06/02/2022, 12:34 PM    PT/OT recommending CIR for pt. Will await CIR assessment and follow up in pt needs alternative plan.  Transition of Care Department [facility]) has reviewed patient and no TOC needs have been identified at this time. We will continue to monitor patient advancement through interdisciplinary progression rounds. If new patient transition needs arise, please place a TOC consult.

## 2022-06-02 NOTE — NC FL2 (Signed)
[place] MEDICAID FL2 LEVEL OF CARE SCREENING TOOL     IDENTIFICATION  Patient Name: Joseph Shepard Birthdate: Oct 20, 1965 Sex: male Admission Date (Current Location): 05/31/2022  [facility] [place] Number:  [employer] and Address:  [facility],  [address] [place] [address]      Provider Number: [id number]  Attending Physician Name and Address:  Kendell Bane, MD  Relative Name and Phone Number:       Current Level of Care: Hospital Recommended Level of Care: Skilled Nursing Facility Prior Approval Number:    Date Approved/Denied:   PASRR Number:    Discharge Plan: SNF    Current Diagnoses: Patient Active Problem List   Diagnosis Date Noted   Stroke [facility]) 05/31/2022   Uncontrolled type 2 diabetes mellitus with hyperglycemia, without long-term current use of insulin (HCC) 05/31/2022   Hyponatremia 05/31/2022   Tobacco abuse 05/31/2022   Leukocytosis 05/31/2022   Radiculopathy 04/17/2016   Chest pain, atypical 07/31/2011   Hypercholesterolemia 07/31/2011   HTN (hypertension), benign 07/31/2011    Orientation RESPIRATION BLADDER Height & Weight     Self, Time, Situation, Place  Normal Continent Weight: 170 lb 8 oz (77.3 kg) Height:  5\' 10"  (177.8 cm)  BEHAVIORAL SYMPTOMS/MOOD NEUROLOGICAL BOWEL NUTRITION STATUS      Continent Diet (see dc summary)  AMBULATORY STATUS COMMUNICATION OF NEEDS Skin   Extensive Assist Verbally Normal                       Personal Care Assistance Level of Assistance  Bathing, Feeding, Dressing Bathing Assistance: Limited assistance Feeding assistance: Independent Dressing Assistance: Limited assistance     Functional Limitations Info  Sight, Hearing, Speech Sight Info: Adequate Hearing Info: Adequate Speech Info: Adequate    SPECIAL CARE FACTORS FREQUENCY  PT (By licensed PT), OT (By licensed OT)     PT Frequency: 5x week OT Frequency: 3x week            Contractures  Contractures Info: Not present    Additional Factors Info  Code Status, Allergies, Psychotropic Code Status Info: Full Allergies Info: NKA Psychotropic Info: Celexa, Xanax         Current Medications (06/02/2022):  This is the current hospital active medication list Current Facility-Administered Medications  Medication Dose Route Frequency Provider Last Rate Last Admin   acetaminophen (TYLENOL) tablet 650 mg  650 mg Oral Q4H PRN 06/04/2022, MD       Or   acetaminophen (TYLENOL) 160 MG/5ML solution 650 mg  650 mg Per Tube Q4H PRN Lahoma Crocker, MD       Or   acetaminophen (TYLENOL) suppository 650 mg  650 mg Rectal Q4H PRN Lahoma Crocker, MD       ALPRAZolam Lahoma Crocker) tablet 1 mg  1 mg Oral BID PRN Shahmehdi, Seyed A, MD       amphetamine-dextroamphetamine (ADDERALL) tablet 20 mg  20 mg Oral BID WC Shahmehdi, Seyed A, MD   20 mg at 06/02/22 0957   aspirin EC tablet 81 mg  81 mg Oral Daily Shahmehdi, Seyed A, MD   81 mg at 06/02/22 0958   atorvastatin (LIPITOR) tablet 40 mg  40 mg Oral q1800 06/04/22, MD   40 mg at 06/01/22 1801   citalopram (CELEXA) tablet 40 mg  40 mg Oral Daily Shahmehdi, 06/03/22 A, MD   40 mg at 06/02/22 0957   clopidogrel (PLAVIX) tablet 75  mg  75 mg Oral Daily Shahmehdi, Seyed A, MD   75 mg at 06/02/22 0957   fenofibrate tablet 160 mg  160 mg Oral Daily Shahmehdi, Seyed A, MD   160 mg at 06/02/22 0957   gabapentin (NEURONTIN) capsule 300 mg  300 mg Oral Q8H Shahmehdi, Seyed A, MD   300 mg at 06/02/22 0614   heparin injection 5,000 Units  5,000 Units Subcutaneous Q8H Lahoma Crocker, MD   5,000 Units at 06/02/22 1327   insulin aspart (novoLOG) injection 0-15 Units  0-15 Units Subcutaneous TID WC Shahmehdi, Seyed A, MD       insulin glargine-yfgn (SEMGLEE) injection 35 Units  35 Units Subcutaneous QHS Shahmehdi, Seyed A, MD       lamoTRIgine (LAMICTAL) tablet 100 mg  100 mg Oral BID Shahmehdi, Seyed A, MD   100 mg at 06/02/22 0957    oxyCODONE-acetaminophen (PERCOCET/ROXICET) 5-325 MG per tablet 1-2 tablet  1-2 tablet Oral Q6H PRN Nevin Bloodgood A, MD   1 tablet at 06/02/22 0957   senna-docusate (Senokot-S) tablet 1 tablet  1 tablet Oral QHS PRN Lahoma Crocker, MD       Current Outpatient Medications  Medication Sig Dispense Refill   ALPRAZolam (XANAX) 1 MG tablet Take 1 mg by mouth 2 (two) times daily.     amLODipine (NORVASC) 10 MG tablet Take 10 mg by mouth daily.     amphetamine-dextroamphetamine (ADDERALL) 20 MG tablet Take 20 mg by mouth 2 (two) times daily.     atorvastatin (LIPITOR) 40 MG tablet Take 40 mg by mouth daily.     citalopram (CELEXA) 40 MG tablet Take 40 mg by mouth daily.     DULoxetine (CYMBALTA) 60 MG capsule Take 120 mg by mouth 2 (two) times daily.     famotidine (PEPCID) 40 MG tablet Take 40 mg by mouth daily.     fenofibrate 160 MG tablet Take 160 mg by mouth daily.     hydrochlorothiazide (HYDRODIURIL) 25 MG tablet Take 25 mg by mouth daily.       lamoTRIgine (LAMICTAL) 100 MG tablet Take 100 mg by mouth 2 (two) times daily.     lisinopril (PRINIVIL,ZESTRIL) 40 MG tablet Take 40 mg by mouth daily.       metFORMIN (GLUCOPHAGE) 1000 MG tablet Take 1,000 mg by mouth 2 (two) times daily.     omeprazole (PRILOSEC) 40 MG capsule Take 40 mg by mouth 2 (two) times daily.     oxyCODONE-acetaminophen (PERCOCET) 10-325 MG tablet Take 1 tablet by mouth every 6 (six) hours.     pregabalin (LYRICA) 150 MG capsule Take by mouth.     Vitamin D, Ergocalciferol, (DRISDOL) 50000 units CAPS capsule Take 50,000 Units by mouth every Wednesday.      gabapentin (NEURONTIN) 300 MG capsule Take 300 mg by mouth 3 (three) times daily. (Patient not taking: Reported on 06/01/2022)     LANTUS SOLOSTAR 100 UNIT/ML Solostar Pen Inject 30 Units into the skin daily. (Patient not taking: Reported on 06/01/2022)     metFORMIN (GLUCOPHAGE) 500 MG tablet Take 500 mg by mouth every evening. (Patient not taking: Reported on  06/01/2022)       Discharge Medications: Please see discharge summary for a list of discharge medications.  Relevant Imaging Results:  Relevant Lab Results:   Additional Information SSN: [id number] [place]

## 2022-06-02 NOTE — ED Notes (Signed)
Patient undergoing teleneuro at this time.

## 2022-06-03 ENCOUNTER — Other Ambulatory Visit (HOSPITAL_COMMUNITY): Payer: Self-pay

## 2022-06-03 DIAGNOSIS — R531 Weakness: Secondary | ICD-10-CM | POA: Diagnosis not present

## 2022-06-03 DIAGNOSIS — E871 Hypo-osmolality and hyponatremia: Secondary | ICD-10-CM | POA: Diagnosis not present

## 2022-06-03 DIAGNOSIS — I639 Cerebral infarction, unspecified: Secondary | ICD-10-CM | POA: Diagnosis not present

## 2022-06-03 DIAGNOSIS — I10 Essential (primary) hypertension: Secondary | ICD-10-CM | POA: Diagnosis not present

## 2022-06-03 DIAGNOSIS — E7800 Pure hypercholesterolemia, unspecified: Secondary | ICD-10-CM | POA: Diagnosis not present

## 2022-06-03 DIAGNOSIS — G459 Transient cerebral ischemic attack, unspecified: Secondary | ICD-10-CM | POA: Diagnosis not present

## 2022-06-03 LAB — COMPREHENSIVE METABOLIC PANEL
ALT: 15 U/L (ref 0–44)
AST: 11 U/L — ABNORMAL LOW (ref 15–41)
Albumin: 3.6 g/dL (ref 3.5–5.0)
Alkaline Phosphatase: 64 U/L (ref 38–126)
Anion gap: 8 (ref 5–15)
BUN: 13 mg/dL (ref 6–20)
CO2: 26 mmol/L (ref 22–32)
Calcium: 9 mg/dL (ref 8.9–10.3)
Chloride: 100 mmol/L (ref 98–111)
Creatinine, Ser: 0.6 mg/dL — ABNORMAL LOW (ref 0.61–1.24)
GFR, Estimated: >60 mL/min (ref >60)
Glucose, Bld: 250 mg/dL — ABNORMAL HIGH (ref 70–99)
Potassium: 3.5 mmol/L (ref 3.5–5.1)
Sodium: 134 mmol/L — ABNORMAL LOW (ref 135–145)
Total Bilirubin: 0.8 mg/dL (ref 0.3–1.2)
Total Protein: 6.4 g/dL — ABNORMAL LOW (ref 6.5–8.1)

## 2022-06-03 LAB — GLUCOSE, CAPILLARY
Glucose-Capillary: 183 mg/dL — ABNORMAL HIGH (ref 70–99)
Glucose-Capillary: 290 mg/dL — ABNORMAL HIGH (ref 70–99)

## 2022-06-03 LAB — CBC WITH DIFFERENTIAL/PLATELET
Abs Immature Granulocytes: 0.04 10*3/uL (ref 0.00–0.07)
Basophils Absolute: 0.1 10*3/uL (ref 0.0–0.1)
Basophils Relative: 1 %
Eosinophils Absolute: 0.2 10*3/uL (ref 0.0–0.5)
Eosinophils Relative: 3 %
HCT: 41.1 % (ref 39.0–52.0)
Hemoglobin: 13.8 g/dL (ref 13.0–17.0)
Immature Granulocytes: 1 %
Lymphocytes Relative: 33 %
Lymphs Abs: 2.5 10*3/uL (ref 0.7–4.0)
MCH: 28.7 pg (ref 26.0–34.0)
MCHC: 33.6 g/dL (ref 30.0–36.0)
MCV: 85.4 fL (ref 80.0–100.0)
Monocytes Absolute: 0.5 10*3/uL (ref 0.1–1.0)
Monocytes Relative: 7 %
Neutro Abs: 4.2 10*3/uL (ref 1.7–7.7)
Neutrophils Relative %: 55 %
Platelets: 235 10*3/uL (ref 150–400)
RBC: 4.81 MIL/uL (ref 4.22–5.81)
RDW: 14.6 % (ref 11.5–15.5)
WBC: 7.5 10*3/uL (ref 4.0–10.5)
nRBC: 0 % (ref 0.0–0.2)

## 2022-06-03 LAB — ANA W/REFLEX IF POSITIVE: Anti Nuclear Antibody (ANA): NEGATIVE

## 2022-06-03 MED ORDER — ATORVASTATIN CALCIUM 40 MG PO TABS: 40.0000 mg | ORAL_TABLET | Freq: Every day | ORAL | 0 refills | Status: AC

## 2022-06-03 MED ORDER — LANCETS THIN MISC: 4.0000 | Freq: Four times a day (QID) | 0 refills | Status: AC

## 2022-06-03 MED ORDER — ASPIRIN 81 MG PO TBEC
81.0000 mg | DELAYED_RELEASE_TABLET | Freq: Every day | ORAL | 12 refills | Status: AC
Start: 2022-06-04 — End: ?

## 2022-06-03 MED ORDER — GLUCOSE BLOOD VI STRP: ORAL_STRIP | 1 refills | Status: AC

## 2022-06-03 MED ORDER — FAMOTIDINE 40 MG PO TABS: 40.0000 mg | ORAL_TABLET | Freq: Every day | ORAL | 1 refills | Status: AC

## 2022-06-03 MED ORDER — METFORMIN HCL 1000 MG PO TABS: 1000.0000 mg | ORAL_TABLET | Freq: Two times a day (BID) | ORAL | 2 refills | Status: AC

## 2022-06-03 MED ORDER — LAMOTRIGINE 100 MG PO TABS: 100.0000 mg | ORAL_TABLET | Freq: Two times a day (BID) | ORAL | 2 refills | Status: AC

## 2022-06-03 MED ORDER — GABAPENTIN 300 MG PO CAPS: 300.0000 mg | ORAL_CAPSULE | Freq: Three times a day (TID) | ORAL | 2 refills | Status: AC

## 2022-06-03 MED ORDER — LANTUS SOLOSTAR 100 UNIT/ML ~~LOC~~ SOPN: 40.0000 [IU] | PEN_INJECTOR | Freq: Every day | SUBCUTANEOUS | 3 refills | Status: AC

## 2022-06-03 MED ORDER — DULOXETINE HCL 60 MG PO CPEP: 120.0000 mg | ORAL_CAPSULE | Freq: Two times a day (BID) | ORAL | 3 refills | Status: AC

## 2022-06-03 MED ORDER — AMLODIPINE BESYLATE 10 MG PO TABS: 10.0000 mg | ORAL_TABLET | Freq: Every day | ORAL | 0 refills | Status: AC

## 2022-06-03 MED ORDER — INSULIN GLARGINE-YFGN 100 UNIT/ML ~~LOC~~ SOLN
40.0000 [IU] | Freq: Every day | SUBCUTANEOUS | Status: DC
  Filled 2022-06-03: qty 0.4

## 2022-06-03 MED ORDER — ACCU-CHEK SOFTCLIX LANCETS MISC: 5 refills | Status: AC

## 2022-06-03 MED ORDER — CITALOPRAM HYDROBROMIDE 40 MG PO TABS: 40.0000 mg | ORAL_TABLET | Freq: Every day | ORAL | 0 refills | Status: AC

## 2022-06-03 NOTE — Plan of Care (Signed)
  Problem: Acute Rehab OT Goals (only OT should resolve) Goal: Pt. Will Perform Grooming Flowsheets (Taken 06/03/2022 1008) Pt Will Perform Grooming:  standing  with modified independence Goal: Pt. Will Perform Lower Body Dressing Flowsheets (Taken 06/03/2022 1008) Pt Will Perform Lower Body Dressing:  Independently  sitting/lateral leans Goal: Pt. Will Transfer To Toilet Flowsheets (Taken 06/03/2022 1008) Pt Will Transfer to Toilet:  with modified independence  ambulating Goal: Pt/Caregiver Will Perform Home Exercise Program Flowsheets (Taken 06/03/2022 1008) Pt/caregiver will Perform Home Exercise Program:  Increased strength  Right Upper extremity  Independently  Jessee Mezera OT, MOT

## 2022-06-03 NOTE — Evaluation (Signed)
Occupational Therapy Evaluation Patient Details Name: Joseph Shepard MRN: [id number] DOB: 19-May-1966 Today's Date: 06/03/2022   History of Present Illness Joseph Shepard is a 56 y.o. male with medical history significant of diabetes mellitus type 2 on metformin, depression, hyperlipidemia, GERD, neuropathic pain, hypertension, and vitamin D deficiency as well as anxiety who presented to the emergency department with a complaint of weakness since Tuesday.  Per the patient's girlfriend he has been very weak for several days and on Tuesday they noticed the weakness.  He has difficulty moving his right leg his right great toe and his right upper extremity.  He is very weak on that side.  Prior to Tuesday he was able to walk normally and use his right upper extremity normally.  He is right-handed.  According to his fiance he has been without insulin for over 6 months and has lost 100 pounds but is very thirsty.  Patient reports that he is short of breath and that he has pain all over.   Clinical Impression   Pt agreeable to OT evaluation. Pt demonstrates  unsteady balance in standing but able to use RW for transfer in room with supervision assist. Pt is noted to limit use of R UE likely due to weakness and decreased coordination as observed during formal testing. Pt demonstrates difficulty with sequential finger touching as well as finger to nose test. Pt has 24/7 assist at home and is able to ambulate without physical assist as long as RW is being used. Pt will benefit from continued OT in the hospital and recommended venue below to increase strength, balance, and endurance for safe ADL's.         Recommendations for follow up therapy are one component of a multi-disciplinary discharge planning process, led by the attending physician.  Recommendations may be updated based on patient status, additional functional criteria and insurance authorization.   Follow Up Recommendations  Outpatient OT ;  follow up with PCP about optometrist evaluation as a result of increase in blurry vision.    Assistance Recommended at Discharge Intermittent Supervision/Assistance  Patient can return home with the following A little help with walking and/or transfers;A little help with bathing/dressing/bathroom;Assistance with cooking/housework;Assist for transportation;Help with stairs or ramp for entrance    Functional Status Assessment  Patient has had a recent decline in their functional status and demonstrates the ability to make significant improvements in function in a reasonable and predictable amount of time.  Equipment Recommendations  None recommended by OT       Precautions / Restrictions Precautions Precautions: Fall Restrictions Weight Bearing Restrictions: No      Mobility Bed Mobility Overal bed mobility: Modified Independent             General bed mobility comments: slightly labored    Transfers Overall transfer level: Needs assistance Equipment used: Rolling walker (2 wheels) Transfers: Sit to/from Stand, Bed to chair/wheelchair/BSC Sit to Stand: Supervision     Step pivot transfers: Supervision     General transfer comment: Mild labored movement with use or RW to transfer to toilet and back.      Balance Overall balance assessment: Needs assistance Sitting-balance support: Feet supported, No upper extremity supported Sitting balance-Leahy Scale: Good Sitting balance - Comments: seated at EOB   Standing balance support: During functional activity, Bilateral upper extremity supported, Reliant on assistive device for balance Standing balance-Leahy Scale: Fair Standing balance comment: fair using RW  ADL either performed or assessed with clinical judgement   ADL Overall ADL's : Needs assistance/impaired Eating/Feeding: Modified independent;Sitting   Grooming: Modified independent;Supervision/safety;Standing   Upper Body  Bathing: Modified independent;Sitting   Lower Body Bathing: Modified independent;Sitting/lateral leans   Upper Body Dressing : Modified independent;Sitting   Lower Body Dressing: Modified independent;Sitting/lateral leans Lower Body Dressing Details (indicate cue type and reason): extended time and labored movement to don and doff R sock seated at [employer] Transfer: Supervision/safety;Rolling walker (2 wheels);Ambulation Toilet Transfer Details (indicate cue type and reason): EOB to toilet and back with supervision using RW         Functional mobility during ADLs: Supervision/safety;Rolling walker (2 wheels)       Vision Baseline Vision/History: 1 Wears glasses Ability to See in Adequate Light: 1 Impaired Patient Visual Report: Blurring of vision (Reports increase in blurry vision a week ago.) Vision Assessment?: Yes Additional Comments: Pt reports vision primarily in R eye is more blurry starting a week ago. Reported that his PCP did not schedule him appointment with optometrist till he could better manage is blood surgar levels.                Pertinent Vitals/Pain Pain Assessment Pain Assessment: 0-10 Pain Score: 7  Pain Location: R shoulder and occiptial region of head; R big toe Pain Descriptors / Indicators: Other (Comment) (chronic) Pain Intervention(s): Monitored during session, Repositioned     Hand Dominance Right   Extremity/Trunk Assessment Upper Extremity Assessment Upper Extremity Assessment: RUE deficits/detail RUE Deficits / Details: shoulder flexion 4-/5; elbow flexion 4+/5; elbow extension 4+/5; composit grip 4-/5; wrist extension 4+/5 ; wrist flexion 4+/5. RUE Sensation: decreased light touch RUE Coordination: decreased fine motor;decreased gross motor   Lower Extremity Assessment Lower Extremity Assessment: Defer to PT evaluation   Cervical / Trunk Assessment Cervical / Trunk Assessment: Normal   Communication Communication Communication: No  difficulties   Cognition Arousal/Alertness: Awake/alert Behavior During Therapy: WFL for tasks assessed/performed Overall Cognitive Status: Within Functional Limits for tasks assessed                                                        Home Living Family/patient expects to be discharged to:: Private residence Living Arrangements: Spouse/significant other;Children Available Help at Discharge: Family;Available 24 hours/day Type of Home: Mobile home Home Access: Ramped entrance     Home Layout: One level     Bathroom Shower/Tub: Chief Strategy Officer: Standard Bathroom Accessibility: Yes   Home Equipment: Cane - single point;Grab bars - tub/shower          Prior Functioning/Environment Prior Level of Function : Independent/Modified Independent;Needs assist       Physical Assist : Mobility (physical) Mobility (physical): Transfers   Mobility Comments: household and short distanced community ambulator using [facility]; reported PRN assist for transfers from family. ADLs Comments: Independent, does not drive        OT Problem List: Decreased strength;Decreased activity tolerance;Impaired vision/perception;Impaired sensation;Decreased coordination;Impaired balance (sitting and/or standing)      OT Treatment/Interventions: Self-care/ADL training;Therapeutic exercise;Neuromuscular education;Therapeutic activities;Visual/perceptual remediation/compensation;Patient/family education;Balance training    OT Goals(Current goals can be found in the care plan section) Acute Rehab OT Goals Patient Stated Goal: return home OT Goal Formulation: With patient Time For Goal Achievement: 06/17/22 Potential to Achieve Goals: Good  OT Frequency: Min 1X/week                                   End of Session Equipment Utilized During Treatment: Rolling walker (2 wheels)  Activity Tolerance: Patient tolerated treatment well Patient left: in  bed;with call bell/phone within reach  OT Visit Diagnosis: Unsteadiness on feet (R26.81);Other abnormalities of gait and mobility (R26.89);Muscle weakness (generalized) (M62.81);Other symptoms and signs involving the nervous system (R29.898);Hemiplegia and hemiparesis Hemiplegia - Right/Left: Right Hemiplegia - dominant/non-dominant: Dominant 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

## 2022-06-03 NOTE — Progress Notes (Signed)
Physical Therapy Treatment Patient Details Name: Joseph Shepard MRN: [id number] DOB: 06/25/1966 Today's Date: 06/03/2022   History of Present Illness Joseph Shepard is a 56 y.o. male with medical history significant of diabetes mellitus type 2 on metformin, depression, hyperlipidemia, GERD, neuropathic pain, hypertension, and vitamin D deficiency as well as anxiety who presented to the emergency department with a complaint of weakness since Tuesday.  Per the patient's girlfriend he has been very weak for several days and on Tuesday they noticed the weakness.  He has difficulty moving his right leg his right great toe and his right upper extremity.  He is very weak on that side.  Prior to Tuesday he was able to walk normally and use his right upper extremity normally.  He is right-handed.  According to his fiance he has been without insulin for over 6 months and has lost 100 pounds but is very thirsty.  Patient reports that he is short of breath and that he has pain all over.    PT Comments    Pt sitting EOB eager to ambulate today.  Completed 150 feet with RW with noted fatigue at half way point. Trembling in LE's observed as walking back to room, however no LOB noted.  Pt admits to using his UE more as his LE are weak.  Pt is independent with transfers. Pt returned back to room with nursing assistant present .    Recommendations for follow up therapy are one component of a multi-disciplinary discharge planning process, led by the attending physician.  Recommendations may be updated based on patient status, additional functional criteria and insurance authorization.  Follow Up Recommendations  Acute inpatient rehab (3hours/day)     Assistance Recommended at Discharge Set up Supervision/Assistance  Patient can return home with the following A lot of help with walking and/or transfers;A little help with bathing/dressing/bathroom;Assistance with cooking/housework;Assistance with feeding;Help  with stairs or ramp for entrance   Equipment Recommendations  Rolling walker (2 wheels)    Recommendations for Other Services       Precautions / Restrictions Precautions Precautions: Fall Restrictions Weight Bearing Restrictions: No     Mobility  Bed Mobility Overal bed mobility: Modified Independent             General bed mobility comments: slightly labored    Transfers Overall transfer level: Needs assistance Equipment used: Rolling walker (2 wheels) Transfers: Sit to/from Stand, Bed to chair/wheelchair/BSC Sit to Stand: Supervision   Step pivot transfers: Supervision       General transfer comment: Pt fatigued after 75 feet requesting to go back to room.    Ambulation/Gait Ambulation/Gait assistance: Supervision Gait Distance (Feet): 150 Feet Assistive device: Rolling walker (2 wheels) Gait Pattern/deviations: Decreased stride length, Ataxic, Narrow base of support                  Cognition Arousal/Alertness: Awake/alert Behavior During Therapy: WFL for tasks assessed/performed Overall Cognitive Status: Within Functional Limits for tasks assessed                                           Home Living Family/patient expects to be discharged to:: Private residence Living Arrangements: Spouse/significant other;Children Available Help at Discharge: Family;Available 24 hours/day Type of Home: Mobile home Home Access: Ramped entrance       Home Layout: One level Home Equipment: Cane - single point;Grab bars -  tub/shower      Prior Function            PT Goals (current goals can now be found in the care plan section) Progress towards PT goals: Progressing toward goals    Frequency    Min 5X/week       AM-PAC PT "6 Clicks" Mobility   Outcome Measure  Help needed turning from your back to your side while in a flat bed without using bedrails?: None Help needed moving from lying on your back to sitting on the  side of a flat bed without using bedrails?: None Help needed moving to and from a bed to a chair (including a wheelchair)?: A Lot Help needed standing up from a chair using your arms (e.g., wheelchair or bedside chair)?: A Little Help needed to walk in hospital room?: A Lot Help needed climbing 3-5 steps with a railing? : A Lot 6 Click Score: 17    End of Session   Activity Tolerance: Patient tolerated treatment well;Patient limited by fatigue Patient left: in bed;with call bell/phone within reach Nurse Communication: Mobility status PT Visit Diagnosis: Unsteadiness on feet (R26.81);Other abnormalities of gait and mobility (R26.89);Muscle weakness (generalized) (M62.81)     Time: 9381-0175 PT Time Calculation (min) (ACUTE ONLY): 15 min  Charges:  $Gait Training: 8-22 mins                    Lurena Nida, PTA/CLT [facility] Ph: [phone]    Lurena Nida 06/03/2022, 11:59 AM

## 2022-06-03 NOTE — Evaluation (Signed)
Speech Language Pathology Evaluation Patient Details Name: Joseph Shepard MRN: [id number] DOB: 02-23-1966 Today's Date: 06/03/2022 Time:  -     Problem List:  Patient Active Problem List   Diagnosis Date Noted   Stroke (HCC) 05/31/2022   Uncontrolled type 2 diabetes mellitus with hyperglycemia, without long-term current use of insulin (HCC) 05/31/2022   Hyponatremia 05/31/2022   Tobacco abuse 05/31/2022   Leukocytosis 05/31/2022   Radiculopathy 04/17/2016   Chest pain, atypical 07/31/2011   Hypercholesterolemia 07/31/2011   HTN (hypertension), benign 07/31/2011   Past Medical History:  Past Medical History:  Diagnosis Date   ADD (attention deficit disorder)    Anxiety    Arthritis    Chronic back pain    Depression    Diabetes mellitus without complication (HCC)    pre-diabetic; takes Metformin   GERD (gastroesophageal reflux disease)    Heart murmur    "was told in early 20's that I had a heart murmur"   High cholesterol    History of bronchitis as a child    History of pneumonia    Hypertension    Kidney stones    Multiple gastric ulcers    Sleep apnea    CPAP   Past Surgical History:  Past Surgical History:  Procedure Laterality Date   ANTERIOR CERVICAL DECOMPRESSION/DISCECTOMY FUSION 4 LEVELS N/A 04/17/2016   Procedure: ANTERIOR CERVICAL DECOMPRESSION FUSION CERVICAL 3-4, CERVICAL 4-5, CERVICAL 5-6, CERVICAL 6-7 WITH INSTRUMENTATION AND ALLOGRAFT;  Surgeon: Estill Bamberg, MD;  Location: [facility] OR;  Service: Orthopedics;  Laterality: N/A;   BACK SURGERY     x3   CARDIAC CATHETERIZATION     ESOPHAGOGASTRODUODENOSCOPY     HERNIA REPAIR     TONSILLECTOMY     HPI:  Joseph Shepard is a 56 y.o. male with medical history significant of diabetes mellitus type 2 on metformin, depression, hyperlipidemia, GERD, neuropathic pain, hypertension, and vitamin D deficiency as well as anxiety who presented to the emergency department with a complaint of weakness since Tuesday.   Per the patient's girlfriend he has been very weak for several days and on Tuesday they noticed the weakness.  He has difficulty moving his right leg his right great toe and his right upper extremity.  He is very weak on that side.  Prior to Tuesday he was able to walk normally and use his right upper extremity normally.  He is right-handed.  According to his fiance he has been without insulin for over 6 months and has lost 100 pounds but is very thirsty.  Patient reports that he is short of breath and that he has pain all over. MRI reveals: 1. Unremarkable appearance of the brain.  2. Negative head MRA.   Assessment / Plan / Recommendation Clinical Impression  Speech language evaluation completed while Pt was sitting upright on the edge of the bed; Pt presents with a mild/moderate stutter with initial words on phrases and sentences. Note halting and some repetitions with intial words. Pt demonstrated occasional substitutions with naming and reports frustration with word finding. Effective strategies include slowing down, humming or generating voicing prior to the initial word (stuttering strategy). Slowed rate was very effective and note significant improvement with this strategy. Cognition was noted to be 19/30 mild/mod impairment however, fiance and Pt report memory impairment at baseline and report presentation on [place] SLUMS is seemingly close to baseline. Detailed results below. Pt will benefit from OP ST for a more in depth evaluation and treatment as indicated if  speech changes do not resolve. Pt already has 24 hr care and recommend continue this secondary to cognitive impairment. SLP reviewed above recommendations and strategies with Pt and fiance. ST will continue to follow acutely.     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  Recommendations may be updated based on patient status, additional functional criteria and insurance authorization.    Follow Up Recommendations  Outpatient SLP          Frequency and Duration min 1 x/week  1 week      SLP Evaluation Cognition  Overall Cognitive Status: History of cognitive impairments - at baseline (reports memory difficulty at baseline) Arousal/Alertness: Awake/alert Orientation Level: Oriented X4 Memory: Impaired Memory Impairment: Decreased short term memory       Comprehension  Auditory Comprehension Overall Auditory Comprehension: Appears within functional limits for tasks assessed    Expression Verbal Expression Overall Verbal Expression: Impaired Repetition: No impairment Naming: Impairment Responsive: 76-100% accurate Confrontation: Impaired Written Expression Dominant Hand: Right   Oral / Motor  Motor Speech Overall Motor Speech: Impaired Articulation: Within functional limitis Intelligibility: Intelligible Motor Planning: Impaired Level of Impairment: Word Motor Speech Errors: Aware             Sally Menard H. Romie Levee, CCC-SLP Speech Language Pathologist   Georgetta Haber 06/03/2022, 11:29 AM

## 2022-06-03 NOTE — Inpatient Diabetes Management (Signed)
Inpatient Diabetes Program Recommendations  AACE/ADA: New Consensus Statement on Inpatient Glycemic Control   Target Ranges:  Prepandial:   less than 140 mg/dL      Peak postprandial:   less than 180 mg/dL (1-2 hours)      Critically ill patients:  140 - 180 mg/dL     Latest Reference Range & Units 06/03/22 07:29  Glucose-Capillary 70 - 99 mg/dL 183 (H)    Latest Reference Range & Units 06/02/22 09:02 06/02/22 12:09 06/02/22 14:26 06/02/22 16:20 06/02/22 21:28  Glucose-Capillary 70 - 99 mg/dL 194 (H) 420 (H) 412 (H) 159 (H) 304 (H)   Review of Glycemic Control  Diabetes history: DM2 Outpatient Diabetes medications: Lantus 30 units daily (none in 6 months), Metformin 1000 mg BID  Current orders for Inpatient glycemic control: Semglee 40 units QHS, Novolog 0-15 units TID with meals   Inpatient Diabetes Program Recommendations:     HbgA1C: A1C 14.3% on 05/31/22 indicating an average glucose of 364 mg/dl over the past 2-3 months.   Outpatient: Please provide Rx for glucose monitoring kit (#[id number]). Also, patient states he would be willing to resume basal insulin as long as it is affordable. Patient would prefer insulin pens and would also need insulin pen needles. Lantus SoloStar [id number]) pens are $35 for 30 day supply. Would also need insulin pen needles [phone]).  Thanks, Barnie Alderman, RN, MSN, [place] Diabetes Coordinator Inpatient Diabetes Program [phone] (Team Pager from 8am to [place])

## 2022-06-03 NOTE — Discharge Summary (Signed)
Physician Discharge Summary   Patient: Joseph Shepard MRN: [id number] DOB: 05/22/1966  Admit date:     05/31/2022  Discharge date: 06/03/22  Discharge Physician: Deatra James   PCP: Albertine Patricia, MD (Inactive)   Recommendations at discharge:  -Follow with the PCP within 1-2 weeks - Follow-up with a neurologist within 1-4 weeks -Continue monitoring of blood sugars closely, tighter blood sugar control, and diabetic diet -Compliant with BP meds, and continue current meds -Aggressive PT OT, fall precautions  -Follow-up as an outpatient with neurosurgery Dr. 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 *  Hospital Course: Joseph Shepard is a 56 y.o. male with medical history significant of diabetes mellitus type 2 on metformin, depression, hyperlipidemia, GERD, neuropathic pain, hypertension, and vitamin D deficiency as well as anxiety who presented to the emergency department with a complaint of weakness since Tuesday.   Per the patient's girlfriend he has been very weak for several days and on Tuesday they noticed the weakness.  He has difficulty moving his right leg his right great toe and his right upper extremity.  He is very weak on that side.  Prior to Tuesday he was able to walk normally and use his right upper extremity normally.  He is right-handed.  According to his fiance he has been without insulin for over 6 months and has lost 100 pounds but is very thirsty.  Patient reports that he is short of breath and that he has pain all over.   ED Course:  Sodium low at 131, glucose elevated at 415, creatinine 0.57, anion  gap 9, GFR greater than 60, white blood cell count 15.2.  He is negative for COVID-19.  Glucose in urine is greater than 500 specific gravity is elevated.  CT scan of the head shows no acute intracranial findings seen on noncontrast CT scan of the brain.   Case was discussed with neurology who recommended admission to [facility] as MRI is not available at [facility] on the weekend.  Neurology would like to be consulted if MRI is abnormal and indicative of stroke.   Assessment/Plan Principal Problem:   Stroke [facility]) Active Problems:   Uncontrolled type 2 diabetes mellitus with hyperglycemia, without long-term current use of insulin (HCC)   Hyponatremia   HTN (hypertension), benign   Hypercholesterolemia   Leukocytosis   Tobacco abuse   Stroke symptoms -Improving symptoms  -On admission described : Symptomes became evident on Tuesday this past week.  Patient did not wish to seek any medical help.  Finally  as he was unable to ambulate he agreed to come in. -Initial CT scan did not reveal any intracranial abnormalities.   -MRI/MRA- completed revealing No acute intracranial abnormalities -Bilateral carotid studies report negative for any carotid stenosis  -Cervical spine MRI revealed: IMPRESSION: 1. At C2-C3, moderate to severe right and mild left foraminal stenosis. 2. C3-C7 ACDF with patent canal and foramina at these levels.  Discussed the case with Dr.Nundkumar neurosurgery  -Patient still symptomatic although symptoms of speech and right-sided weakness and paresthesia has improved  -Discussed the case with teleneurology Dr. Hortense Ramal who recommending:  Aspirin 81 mg daily, Atorvastatin 40 mg  daily and Plavix 75 mg daily for 3 weeks    -PT/OT/speech evaluation recommendations -2D echocardiogram: Ejection fraction 60-65%, moderate LVH -Neurochecks Q 4 hrs  -LDL: 82 /triglyceride 193 -A1c: 14.3 -TSH: 0.799  -CRP 0.5 -SCr 10  Given finding consistent with a acute dense stroke,  work-up needed, inpatient neurology soon as possible recommended.   Uncontrolled DM 2 with hyperglycemia  -A1c 14.3 -Resuming home medication including metformin, and Lantus -Apparently patient has been out of medicine insulin for past 6 months -Recommended strict diabetic diet, and resuming home regimen, prescription given    Hyponatremia:  -Pseudohyponatremia likely due to hypoglycemia -Resolved -Continue IV hydration, correcting blood sugars with insulin -Sodium 131,   Hypertension:  --Home medication resumed with exception of HCTZ  Hypercholesterolemia:  Lipid Panel     Component Value Date/Time   CHOL 169 06/01/2022 0604   TRIG 193 (H) 06/01/2022 0604   HDL 48 06/01/2022 0604   CHOLHDL 3.5 06/01/2022 0604   VLDL 39 06/01/2022 0604   LDLCALC 82 06/01/2022 0604   - Restart his Lipitor but increase his dose to 40 mg daily as he was previously on 10mg  daily   Leukocytosis:  -Resolved -WBC at 15.2, no signs of infection, UA clean  -Possibly reactive, monitoring closely  Tobacco abuse: - We will start nicotine patch.  Patient was counseled regarding tobacco cessation.   Assessment and Plan: No notes have been filed under this hospital service. Service: Hospitalist        Consultants: Neurologist Procedures performed: CT of the head, MRI/MRA of the brain Disposition: Home Diet recommendation:  Discharge Diet Orders (From admission, onward)     Start     Ordered   06/03/22 0000  Diet - low sodium heart healthy        06/03/22 1225           Carb modified diet DISCHARGE MEDICATION: Allergies as of 06/03/2022   No Known Allergies      Medication List     STOP taking these medications    hydrochlorothiazide 25 MG tablet Commonly known as: HYDRODIURIL   lisinopril 40 MG tablet Commonly known as: ZESTRIL   omeprazole 40 MG capsule Commonly known as: PRILOSEC   pregabalin 150 MG capsule Commonly known as: LYRICA       TAKE these  medications    Accu-Chek Softclix Lancets lancets Use as directed.   Lancets Thin Misc 4 Lancets by Does not apply route 4 (four) times daily.   ALPRAZolam 1 MG tablet Commonly known as: XANAX Take 1 mg by mouth 2 (two) times daily.   amLODipine 10 MG tablet Commonly known as: NORVASC Take 1 tablet (10 mg total) by mouth daily.   amphetamine-dextroamphetamine 20 MG tablet Commonly known as: ADDERALL Take 20 mg by mouth 2 (two) times daily.   aspirin EC 81 MG tablet Take 1 tablet (81 mg total) by mouth daily. Swallow whole. Start taking on: June 04, 2022   atorvastatin 40 MG tablet Commonly known as: LIPITOR Take 1 tablet (40 mg total) by mouth daily.   citalopram 40 MG tablet Commonly known as: CELEXA Take 1 tablet (40 mg total) by mouth daily.   DULoxetine 60 MG capsule Commonly known as: CYMBALTA Take 2 capsules (120 mg total) by mouth 2 (two) times daily.   famotidine 40 MG tablet Commonly known as: PEPCID Take 1 tablet (40 mg total) by mouth daily.   fenofibrate 160 MG tablet Take 160 mg by mouth daily.  gabapentin 300 MG capsule Commonly known as: NEURONTIN Take 1 capsule (300 mg total) by mouth 3 (three) times daily.   glucose blood test strip Use as instructed   lamoTRIgine 100 MG tablet Commonly known as: LAMICTAL Take 1 tablet (100 mg total) by mouth 2 (two) times daily.   Lantus SoloStar 100 UNIT/ML Solostar Pen Generic drug: insulin glargine Inject 40 Units into the skin daily. What changed: how much to take   metFORMIN 1000 MG tablet Commonly known as: GLUCOPHAGE Take 1 tablet (1,000 mg total) by mouth 2 (two) times daily. What changed: Another medication with the same name was removed. Continue taking this medication, and follow the directions you see here.   oxyCODONE-acetaminophen 10-325 MG tablet Commonly known as: PERCOCET Take 1 tablet by mouth every 6 (six) hours.   Vitamin D (Ergocalciferol) 1.25 MG (50000 UNIT) Caps  capsule Commonly known as: DRISDOL Take 50,000 Units by mouth every Wednesday.               Durable Medical Equipment  (From admission, onward)           Start     Ordered   06/03/22 1224  DME Walker  Once       Question Answer Comment  Walker: With 5 Inch Wheels   Patient needs a walker to treat with the following condition Falls frequently      06/03/22 1225   06/03/22 1135  DME Glucometer  Once        06/03/22 1136   06/03/22 0811  For home use only DME Walker rolling  Once       Comments: Impaired gait with frequent loss of balance when walking without AD, safer using RW  Question Answer Comment  Walker: With 5 Inch Wheels   Patient needs a walker to treat with the following condition Gait difficulty      06/03/22 0811            Discharge Exam: Filed Weights   05/31/22 1406  Weight: 77.3 kg      Physical Exam:   General:  AAO x 3,  cooperative, no distress;   HEENT:  Normocephalic, PERRL, otherwise with in Normal limits   Neuro:  Asymmetric facial features, left-sided upper and lower extremity weakness 3/5 right 5/5,  unsteady gait CNII-XII intact. , normal motor and sensation, reflexes intact   Lungs:   Clear to auscultation BL, Respirations unlabored,  No wheezes / crackles  Cardio:    S1/S2, RRR, No murmure, No Rubs or Gallops   Abdomen:  Soft, non-tender, bowel sounds active all four quadrants, no guarding or peritoneal signs.  Muscular  skeletal:  Limited exam -global generalized weaknesses - in bed, able to move all 4 extremities,   2+ pulses,  symmetric, No pitting edema  Skin:  Dry, warm to touch, negative for any Rashes,  Wounds: Please see nursing documentation          Condition at discharge: good  The results of significant diagnostics from this hospitalization (including imaging, microbiology, ancillary and laboratory) are listed below for reference.   Imaging Studies: MR CERVICAL SPINE WO CONTRAST  Result Date:  06/02/2022 CLINICAL DATA:  Cervical radiculopathy, no red flags EXAM: MRI CERVICAL SPINE WITHOUT CONTRAST TECHNIQUE: Multiplanar, multisequence MR imaging of the cervical spine was performed. No intravenous contrast was administered. COMPARISON:  Intraoperative fluoroscopy from 04/17/2016. FINDINGS: Alignment: Normal. Vertebrae: C3-C7 ACDF. No visible bone marrow edema to suggest acute fracture discitis/osteomyelitis. No suspicious bone  lesions. Cord: Normal cord signal. Posterior Fossa, vertebral arteries, paraspinal tissues: Visualized vertebral artery flow voids are maintained. No evidence of acute abnormality in the visualized posterior fossa. Disc levels: C2-C3: Right greater than left facet and uncovertebral hypertrophy with resulting moderate to severe right foraminal stenosis. Mild left foraminal stenosis. Patent canal. C3-C4: ACDF.  Patent canal and foramina. C4-C5: ACDF. Patent canal and foramina. C5-C6: ACDF. Patent canal and foramina. C6-C7: ACDF. Patent canal and foramina. C7-T1: No significant disc protrusion, foraminal stenosis, or canal stenosis. IMPRESSION: 1. At C2-C3, moderate to severe right and mild left foraminal stenosis. 2. C3-C7 ACDF with patent canal and foramina at these levels. Electronically Signed   By: Margaretha Sheffield M.D.   On: 06/02/2022 11:33   EEG adult  Result Date: 06/02/2022 Lora Havens, MD     06/02/2022  5:28 PM Patient Name: Joseph Shepard MRN: [id number] Epilepsy Attending: Lora Havens Referring Physician/Provider: Lora Havens, MD Date: 06/02/2022 Duration: 22.05 mins Patient history: 56 year old male with uncontrolled diabetes and prior cervical fusion presented with right sided weakness and paresthesias worsened from baseline.  EEG to evaluate for seizure. Level of alertness: Awake, asleep AEDs during EEG study: None Technical aspects: This EEG study was done with scalp electrodes positioned according to the 10-20 International system of electrode  placement. Electrical activity was reviewed with band pass filter of 1-70Hz , sensitivity of 7 uV/mm, display speed of 68mm/sec with a 60Hz  notched filter applied as appropriate. EEG data were recorded continuously and digitally stored.  Video monitoring was available and reviewed as appropriate. Description: The posterior dominant rhythm consists of 9 Hz activity of moderate voltage (25-35 uV) seen predominantly in posterior head regions, symmetric and reactive to eye opening and eye closing. Sleep was characterized by sleep spindles (12 to 14 Hz), maximal frontocentral region. Hyperventilation and photic stimulation were not performed.   IMPRESSION: This study is within normal limits. No seizures or epileptiform discharges were seen throughout the recording. A normal interictal EEG does not exclude nor support the diagnosis of epilepsy. Priyanka O Yadav   US Carotid Bilateral  Result Date: 06/02/2022 CLINICAL DATA:  Left-sided weakness Dysarthria Aphasia Hypertension Hyperlipidemia Diabetes Tobacco use EXAM: BILATERAL CAROTID DUPLEX ULTRASOUND TECHNIQUE: Pearline Cables scale imaging, color Doppler and duplex ultrasound were performed of bilateral carotid and vertebral arteries in the neck. COMPARISON:  None available FINDINGS: Criteria: Quantification of carotid stenosis is based on velocity parameters that correlate the residual internal carotid diameter with NASCET-based stenosis levels, using the diameter of the distal internal carotid lumen as the denominator for stenosis measurement. The following velocity measurements were obtained: RIGHT ICA: 82/32 cm/sec CCA: 123456 cm/sec SYSTOLIC ICA/CCA RATIO:  0.7 ECA: 105 cm/sec LEFT ICA: 69/30 cm/sec CCA: [id number] cm/sec SYSTOLIC ICA/CCA RATIO:  0.6 ECA: 95 cm/sec RIGHT CAROTID ARTERY: No significant atheromatous plaque. RIGHT VERTEBRAL ARTERY:  Antegrade flow. LEFT CAROTID ARTERY:  No significant atheromatous plaque. LEFT VERTEBRAL ARTERY:  Antegrade flow. IMPRESSION: No  significant stenosis of internal carotid arteries. Electronically Signed   By: Miachel Roux M.D.   On: 06/02/2022 10:53   MR BRAIN WO CONTRAST  Result Date: 06/02/2022 CLINICAL DATA:  Transient ischemic attack (TIA); Stroke, follow up. EXAM: MRI HEAD WITHOUT CONTRAST MRA HEAD WITHOUT CONTRAST TECHNIQUE: Multiplanar, multi-echo pulse sequences of the brain and surrounding structures were acquired without intravenous contrast. Angiographic images of the Circle of Willis were acquired using MRA technique without intravenous contrast. COMPARISON:  Head CT 05/31/2022 FINDINGS: MRI HEAD FINDINGS Brain: There is  no evidence of an acute infarct, intracranial hemorrhage, mass, midline shift, or extra-axial fluid collection. The ventricles and sulci are within normal limits for age. No significant white matter disease is seen. Vascular: Major intracranial vascular flow voids are preserved. Skull and upper cervical spine: Unremarkable bone marrow signal. Prior anterior cervical spine fusion. Sinuses/Orbits: Unremarkable orbits. No significant inflammatory changes in the paranasal sinuses. Clear mastoid air cells. Other: None. MRA HEAD FINDINGS Anterior circulation: The internal carotid arteries are widely patent from skull base to carotid termini. ACAs and MCAs are patent without evidence of a proximal branch occlusion or significant proximal stenosis. The left A1 segment is dominant. No aneurysm is identified. Posterior circulation: The included intracranial portions of the vertebral arteries are widely patent to the basilar with the left being mildly dominant. The right PICA and left AICA appear dominant. Patent SCA origins are seen bilaterally. The basilar artery is widely patent. Both PCAs are patent without evidence of a significant proximal stenosis. No aneurysm is identified. Anatomic variants: None of significance. IMPRESSION: 1. Unremarkable appearance of the brain. 2. Negative head MRA. Electronically Signed    By: Logan Bores M.D.   On: 06/02/2022 09:09   MR ANGIO HEAD WO CONTRAST  Result Date: 06/02/2022 CLINICAL DATA:  Transient ischemic attack (TIA); Stroke, follow up. EXAM: MRI HEAD WITHOUT CONTRAST MRA HEAD WITHOUT CONTRAST TECHNIQUE: Multiplanar, multi-echo pulse sequences of the brain and surrounding structures were acquired without intravenous contrast. Angiographic images of the Circle of Willis were acquired using MRA technique without intravenous contrast. COMPARISON:  Head CT 05/31/2022 FINDINGS: MRI HEAD FINDINGS Brain: There is no evidence of an acute infarct, intracranial hemorrhage, mass, midline shift, or extra-axial fluid collection. The ventricles and sulci are within normal limits for age. No significant white matter disease is seen. Vascular: Major intracranial vascular flow voids are preserved. Skull and upper cervical spine: Unremarkable bone marrow signal. Prior anterior cervical spine fusion. Sinuses/Orbits: Unremarkable orbits. No significant inflammatory changes in the paranasal sinuses. Clear mastoid air cells. Other: None. MRA HEAD FINDINGS Anterior circulation: The internal carotid arteries are widely patent from skull base to carotid termini. ACAs and MCAs are patent without evidence of a proximal branch occlusion or significant proximal stenosis. The left A1 segment is dominant. No aneurysm is identified. Posterior circulation: The included intracranial portions of the vertebral arteries are widely patent to the basilar with the left being mildly dominant. The right PICA and left AICA appear dominant. Patent SCA origins are seen bilaterally. The basilar artery is widely patent. Both PCAs are patent without evidence of a significant proximal stenosis. No aneurysm is identified. Anatomic variants: None of significance. IMPRESSION: 1. Unremarkable appearance of the brain. 2. Negative head MRA. Electronically Signed   By: Logan Bores M.D.   On: 06/02/2022 09:09   ECHOCARDIOGRAM  COMPLETE  Result Date: 06/01/2022    ECHOCARDIOGRAM REPORT   Patient Name:   Joseph Shepard Date of Exam: 06/01/2022 Medical Rec #:  [id number]         Height:       70.0 in Accession #:    [id number]        Weight:       170.5 lb Date of Birth:  08/19/1966        BSA:          1.950 m Patient Age:    67 years          BP:  149/100 mmHg Patient Gender: M                 HR:           70 bpm. [facility] Procedure: 2D Echo, Cardiac Doppler and Color Doppler Indications:    Stroke  History:        Patient has no prior history of Echocardiogram examinations.                 Stroke, Signs/Symptoms:Chest Pain; Risk Factors:Hypertension,                 Diabetes and Current Smoker.  Sonographer:    Mikki Harbor Referring Phys: [phone] THERESA C SHEEHAN IMPRESSIONS  1. Left ventricular ejection fraction, by estimation, is 60 to 65%. The left ventricle has normal function. The left ventricle has no regional wall motion abnormalities. There is moderate left ventricular hypertrophy. Left ventricular diastolic parameters are indeterminate.  2. Right ventricular systolic function is normal. The right ventricular size is normal.  3. Left atrial size was mildly dilated.  4. The mitral valve is normal in structure. No evidence of mitral valve regurgitation. No evidence of mitral stenosis.  5. The aortic valve is tricuspid. Aortic valve regurgitation is trivial. No aortic stenosis is present.  6. Aortic dilatation noted. 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  7. The inferior vena cava is normal in size with greater than 50% respiratory variability, suggesting right atrial pressure of 3 mmHg. FINDINGS  Left Ventricle: Left ventricular ejection fraction, by estimation, is 60 to 65%. The left ventricle has normal function. The left ventricle has no regional wall motion abnormalities. The left ventricular internal cavity size  was normal in size. There is  moderate left ventricular hypertrophy. Left ventricular diastolic parameters are indeterminate. Right Ventricle: The right ventricular size is normal. No increase in right ventricular wall thickness. Right ventricular systolic function is normal. Left Atrium: Left atrial size was mildly dilated. Right Atrium: Right atrial size was normal in size. Pericardium: Trivial pericardial effusion is present. Mitral Valve: The mitral valve is normal in structure. No evidence of mitral valve regurgitation. No evidence of mitral valve stenosis. MV peak gradient, 1.9 mmHg. The mean mitral valve gradient is 1.0 mmHg. Tricuspid Valve: The tricuspid valve is normal in structure. Tricuspid valve regurgitation is trivial. Aortic Valve: The aortic valve is tricuspid. Aortic valve regurgitation is trivial. No aortic stenosis is present. Aortic valve mean gradient measures 3.0 mmHg. Aortic valve peak gradient measures 7.6 mmHg. Aortic valve area, by VTI measures 3.16 cm. Pulmonic Valve: The pulmonic valve was not well visualized. Pulmonic valve regurgitation is not visualized. Aorta: Aortic dilatation noted. 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 Venous: The inferior vena cava is normal in size with greater than 50% respiratory variability, suggesting right atrial pressure of 3 mmHg. IAS/Shunts: The interatrial septum was not well visualized.  LEFT VENTRICLE PLAX 2D LVIDd:         5.10 cm     Diastology LVIDs:         3.20 cm     LV e' medial:    6.85 cm/s LV PW:         1.50 cm     LV E/e' medial:  8.4 LV IVS:        1.30 cm  LV e' lateral:   10.80 cm/s LVOT diam:     2.10 cm     LV E/e' lateral: 5.3 LV SV:         82 LV SV Index:   42 LVOT Area:     3.46 cm  LV Volumes (MOD) LV vol d, MOD A2C: 94.7 ml LV vol d, MOD A4C: 94.2 ml LV vol s, MOD A2C: 37.9 ml LV vol s, MOD A4C: 33.6 ml LV SV MOD A2C:     56.8 ml LV SV MOD A4C:      94.2 ml LV SV MOD BP:      62.1 ml RIGHT VENTRICLE RV Basal diam:  3.45 cm RV Mid diam:    3.60 cm RV S prime:     15.30 cm/s TAPSE (M-mode): 2.4 cm LEFT ATRIUM              Index        RIGHT ATRIUM           Index LA diam:        4.30 cm  2.20 cm/m   RA Area:     21.20 cm LA Vol (A2C):   102.0 ml 52.30 ml/m  RA Volume:   57.00 ml  29.22 ml/m LA Vol (A4C):   49.2 ml  25.23 ml/m LA Biplane Vol: 71.3 ml  36.56 ml/m  AORTIC VALVE                    PULMONIC VALVE AV Area (Vmax):    3.14 cm     PV Vmax:       0.91 m/s AV Area (Vmean):   3.55 cm     PV Peak grad:  3.3 mmHg AV Area (VTI):     3.16 cm AV Vmax:           138.00 cm/s AV Vmean:          77.900 cm/s AV VTI:            0.259 m AV Peak Grad:      7.6 mmHg AV Mean Grad:      3.0 mmHg LVOT Vmax:         125.00 cm/s LVOT Vmean:        79.900 cm/s LVOT VTI:          0.236 m LVOT/AV VTI ratio: 0.91  AORTA Ao Root diam: 4.20 cm Ao Asc diam:  4.10 cm MITRAL VALVE MV Area (PHT): 2.82 cm    SHUNTS MV Area VTI:   4.04 cm    Systemic VTI:  0.24 m MV Peak grad:  1.9 mmHg    Systemic Diam: 2.10 cm MV Mean grad:  1.0 mmHg MV Vmax:       0.69 m/s MV Vmean:      39.7 cm/s MV Decel Time: 269 msec MV E velocity: 57.40 cm/s MV A velocity: 59.60 cm/s MV E/A ratio:  0.96 Oswaldo Milian MD Electronically signed by Oswaldo Milian MD Signature Date/Time: 06/01/2022/2:31:34 PM    Final    CT Head Wo Contrast  Result Date: 05/31/2022 CLINICAL DATA:  Neurological deficit, weakness EXAM: CT HEAD WITHOUT CONTRAST TECHNIQUE: Contiguous axial images were obtained from the base of the skull through the vertex without intravenous contrast. RADIATION DOSE REDUCTION: This exam was performed according to the departmental dose-optimization program which includes automated exposure control, adjustment of the mA and/or kV according to patient size and/or use of iterative reconstruction technique.  COMPARISON:  None Available. FINDINGS: Brain: No acute intracranial  findings are seen. There are no signs of bleeding within the cranium. Ventricles are nondilated. Cortical sulci are prominent. Vascular: Unremarkable. Skull: Unremarkable. Sinuses/Orbits: Unremarkable. Other: None IMPRESSION: No acute intracranial findings are seen in noncontrast CT brain. Electronically Signed   By: Elmer Picker M.D.   On: 05/31/2022 15:47    Microbiology: Results for orders placed or performed during the hospital encounter of 05/31/22  SARS Coronavirus 2 by RT PCR (hospital order, performed in [facility] hospital lab) *cepheid single result test* Anterior Nasal Swab     Status: None   Collection Time: 05/31/22  2:41 PM   Specimen: Anterior Nasal Swab  Result Value Ref Range Status   SARS Coronavirus 2 by RT PCR NEGATIVE NEGATIVE Final    Comment: (NOTE) SARS-CoV-2 target nucleic acids are NOT DETECTED.  The SARS-CoV-2 RNA is generally detectable in upper and lower respiratory specimens during the acute phase of infection. The lowest concentration of SARS-CoV-2 viral copies this assay can detect is 250 copies / mL. A negative result does not preclude SARS-CoV-2 infection and should not be used as the sole basis for treatment or other patient management decisions.  A negative result may occur with improper specimen collection / handling, submission of specimen other than nasopharyngeal swab, presence of viral mutation(s) within the areas targeted by this assay, and inadequate number of viral copies (<250 copies / mL). A negative result must be combined with clinical observations, patient history, and epidemiological information.  Fact Sheet for Patients:   https://www.patel.info/  Fact Sheet for Healthcare Providers: https://hall.com/  This test is not yet approved or  cleared by the [place] FDA and has been authorized for detection and/or diagnosis of SARS-CoV-2 by FDA under an Emergency Use Authorization (EUA).   This EUA will remain in effect (meaning this test can be used) for the duration of the COVID-19 declaration under Section 564(b)(1) of the Act, 21 U.S.C. section 360bbb-3(b)(1), unless the authorization is terminated or revoked sooner.  Performed at [facility], [address] [place], [place] [address]     Labs: CBC: Recent Labs  Lab 05/31/22 1431 06/03/22 0904  WBC 15.2* 7.5  NEUTROABS  --  4.2  HGB 15.5 13.8  HCT 42.6 41.1  MCV 80.7 85.4  PLT 308 [id number]   Basic Metabolic Panel: Recent Labs  Lab 05/31/22 1431 06/03/22 0904  NA 131* 134*  K 4.0 3.5  CL 98 100  CO2 24 26  GLUCOSE 415* 250*  BUN 11 13  CREATININE 0.57* 0.60*  CALCIUM 9.2 9.0   Liver Function Tests: Recent Labs  Lab 06/03/22 0904  AST 11*  ALT 15  ALKPHOS 64  BILITOT 0.8  PROT 6.4*  ALBUMIN 3.6   CBG: Recent Labs  Lab 06/02/22 1426 06/02/22 1620 06/02/22 2128 06/03/22 0729 06/03/22 1139  GLUCAP 412* 159* 304* 183* 290*    Discharge time spent: greater than 45 minutes.  Signed: Deatra James, MD [facility] 06/03/2022

## 2022-06-03 NOTE — TOC Transition Note (Addendum)
Transition of Care [facility]) - CM/SW Discharge Note   Patient Details  Name: Joseph Shepard MRN: [id number] Date of Birth: 03/18/1966  [facility] CM/SW Contact:  Elliot Gault, LCSW Phone Number: 06/03/2022, 2:01 PM   Clinical Narrative:     Pt stable for dc today. PT/OT stating pt could return home with [facility] or outpatient rehab. Spoke with pt who prefers to go home and have outpatient rehab. CMS provider options reviewed. Referred to [facility] as requested.   TOC also worked with pharmacy and DM RN Educator on follow up needs for glucose monitoring and insulin administration. Pt updated on copays and encouraged pt to contact his PCP for refills or to discuss any concerns related to cost so that adjustments can be made to more affordable options rather than pt just not filling the prescriptions. Pt stated that he has already spoken with his PCP and will follow up.  RW arranged for dc as well. There were no other TOC needs.   1603: This LCSW contacted [facility] around 2pm to confirm receipt of RX's and copay amounts. Was informed that the insurance was not going to cover Lantus pens. Multiple conversations were held with insurance, [facility], and [facility] pharmacist. Ultimately, [place] flex touch pens were ordered and new RX's were sent for glucometer and supplies as [facility] said they did not receive the order for those items. Pt and his wife were updated.    Barriers to Discharge: Continued Medical Work up   Patient Goals and CMS Choice Patient states their goals for this hospitalization and ongoing recovery are:: get better CMS Medicare.gov Compare Post Acute Care list provided to:: Patient Choice offered to / list presented to : Patient  Discharge Placement                       Discharge Plan and Services In-house Referral: Clinical Social Work   Post Acute Care Choice: Skilled Nursing Facility                               Social Determinants of Health (SDOH)  Interventions     Readmission Risk Interventions     No data to display

## 2022-06-03 NOTE — Progress Notes (Signed)
Pt has discharge orders, discharge teaching given to pt and fiance bedside, no further questions at this time. Pt wheeled down to main lobby via w/c by staff to vehicle accompanied by fiance.

## 2022-06-06 NOTE — TOC Progression Note (Signed)
Transition of Care [facility]) - Progression Note    Patient Details  Name: Joseph Shepard MRN: [id number] Date of Birth: 1965/12/05  [facility]) CM/SW Contact  Elliot Gault, LCSW Phone Number: 06/06/2022, 4:25 PM  Clinical Narrative:     TOC received call from pt's wife stating that they still do not have pt's insulin and supplies. She states [facility] says they did not receive the orders. This LCSW called [facility] in [place] and spoke with pharmacist. Was informed that even though TOC was informed on the day of dc that RX's could be faxed and this LCSW has a confirmation sheet that the fax went through successfully, that [facility] does not accept faxed rx's any longer. TOC supervisor, Lafonda Mosses, RN, assisted in calling in the verbal rxs as an alternative.  Contacted pt's wife back to inform of above.   Expected Discharge Plan: Skilled Nursing Facility Barriers to Discharge: Continued Medical Work up  Expected Discharge Plan and Services Expected Discharge Plan: Skilled Nursing Facility In-house Referral: Clinical Social Work   Post Acute Care Choice: Skilled Nursing Facility Living arrangements for the past 2 months: Single Family Home Expected Discharge Date: 06/03/22                                     Social Determinants of Health (SDOH) Interventions    Readmission Risk Interventions     No data to display
